# Patient Record
Sex: Female | Born: 1960 | Race: White | Hispanic: No | Marital: Married | Smoking: Former smoker
Health system: Southern US, Community
[De-identification: ages and names within clinical notes are randomized; demographics above are authoritative.]

## PROBLEM LIST (undated history)

## (undated) DIAGNOSIS — N200 Calculus of kidney: Secondary | ICD-10-CM

## (undated) DIAGNOSIS — C439 Malignant melanoma of skin, unspecified: Secondary | ICD-10-CM

## (undated) DIAGNOSIS — I10 Essential (primary) hypertension: Secondary | ICD-10-CM

## (undated) DIAGNOSIS — M797 Fibromyalgia: Secondary | ICD-10-CM

## (undated) HISTORY — PX: OTHER SURGICAL HISTORY: SHX169

## (undated) HISTORY — PX: TUBAL LIGATION: SHX77

## (undated) HISTORY — DX: Malignant melanoma of skin, unspecified: C43.9

---

## 1999-08-05 ENCOUNTER — Other Ambulatory Visit: Admission: RE | Admit: 1999-08-05 | Discharge: 1999-08-05 | Payer: Self-pay | Admitting: Obstetrics and Gynecology

## 1999-10-27 HISTORY — PX: KNEE SURGERY: SHX244

## 2000-04-29 ENCOUNTER — Other Ambulatory Visit: Admission: RE | Admit: 2000-04-29 | Discharge: 2000-04-29 | Payer: Self-pay | Admitting: Obstetrics and Gynecology

## 2000-04-29 ENCOUNTER — Encounter (INDEPENDENT_AMBULATORY_CARE_PROVIDER_SITE_OTHER): Payer: Self-pay

## 2000-11-30 ENCOUNTER — Other Ambulatory Visit: Admission: RE | Admit: 2000-11-30 | Discharge: 2000-11-30 | Payer: Self-pay | Admitting: Obstetrics and Gynecology

## 2001-02-11 ENCOUNTER — Encounter: Admission: RE | Admit: 2001-02-11 | Discharge: 2001-02-11 | Payer: Self-pay | Admitting: Family Medicine

## 2001-02-11 ENCOUNTER — Encounter: Payer: Self-pay | Admitting: Family Medicine

## 2001-12-16 ENCOUNTER — Encounter: Payer: Self-pay | Admitting: Urology

## 2001-12-16 ENCOUNTER — Ambulatory Visit (HOSPITAL_COMMUNITY): Admission: AD | Admit: 2001-12-16 | Discharge: 2001-12-16 | Payer: Self-pay | Admitting: Urology

## 2001-12-22 ENCOUNTER — Emergency Department (HOSPITAL_COMMUNITY): Admission: EM | Admit: 2001-12-22 | Discharge: 2001-12-22 | Payer: Self-pay

## 2002-01-17 ENCOUNTER — Ambulatory Visit (HOSPITAL_COMMUNITY): Admission: RE | Admit: 2002-01-17 | Discharge: 2002-01-17 | Payer: Self-pay | Admitting: Urology

## 2002-01-17 ENCOUNTER — Encounter: Payer: Self-pay | Admitting: Urology

## 2004-05-27 ENCOUNTER — Encounter: Admission: RE | Admit: 2004-05-27 | Discharge: 2004-05-27 | Payer: Self-pay | Admitting: Family Medicine

## 2004-05-30 ENCOUNTER — Encounter: Admission: RE | Admit: 2004-05-30 | Discharge: 2004-05-30 | Payer: Self-pay | Admitting: Family Medicine

## 2005-07-09 ENCOUNTER — Encounter: Admission: RE | Admit: 2005-07-09 | Discharge: 2005-07-09 | Payer: Self-pay | Admitting: Family Medicine

## 2008-05-16 ENCOUNTER — Encounter: Admission: RE | Admit: 2008-05-16 | Discharge: 2008-05-16 | Payer: Self-pay | Admitting: Family Medicine

## 2010-11-11 ENCOUNTER — Encounter
Admission: RE | Admit: 2010-11-11 | Discharge: 2010-11-11 | Payer: Self-pay | Source: Home / Self Care | Attending: Family Medicine | Admitting: Family Medicine

## 2011-03-13 NOTE — Op Note (Signed)
[facility]  Patient:    CAY, KATH Visit Number: [id number] MRN: [id number]          Service Type: DSU Location: DAY Attending Physician:  Evlyn Clines Dictated by:   Excell Seltzer. Annabell Howells, M.D. Proc. Date: 12/16/01 Admit Date:  12/16/2001   CC:         Dr. Dareen Piano, Bertram Savin. Prac.   Operative Report  PROCEDURE:  Cystoscopy, left ureteroscopic stone extraction, insertion of left double J stent.  PREOPERATIVE DIAGNOSIS:  Left UV ______ stone.  POSTOPERATIVE DIAGNOSIS:  Left UV ______ stone.  SURGEON:  Excell Seltzer. Annabell Howells, M.D.  ANESTHESIA:  General.  SPECIMEN:  Stone.  DRAINS:  6 [place] with 24 cm double J stent.  COMPLICATIONS:  None.  INDICATIONS FOR PROCEDURE:  Ms. Stlouis is a 50 year old white female who was seen in consultation with acute onset of left flank pain. CT scan revealed hydronephrosis and a questionable stone. She had had a four week history of some irritative voiding symptoms prior to that. She was initially treated conservatively but she returned to the office today with recurrent pain and IVP confirmed the presence of the stone in the distal ureter and after discussing the options, she elected to undergo ureteroscopic stone extraction. The risks were explained and accepted.  FINDINGS AND PROCEDURE:  The patient was given p.o. Tequin. She was taken to the operating room where general anesthetic was induced. She was placed in lithotomy position. Her perineum and genitalia were prepped with Betadine solution and she was draped in the usual sterile fashion. Cystoscopy was performed using a 22 [place] scope and 12 and 70 degree lenses. Examination revealed a normal urethra. The bladder wall was smooth and pale without lesions. The ureteral orifices were in the normal anatomic position. An attempt was made to pass the 6 French ureteroscope up the right ureteral orifice. I was able to negotiate the meatus, but  there was some stricturing proximal to the meatus. The stone could not be visualized at this point. A guidewire was then passed up the ureter to the kidney and a 4 cm 15 French balloon dilation catheter was then passed in the distal ureter where it was dilated to 12 atmospheres. There was some waisting of the distal ureter 2 cm proximal to the meatus. Once the dilation had been completed, the wire was left in place and a 6 French ureteroscope was then passed along side the wire. There was some splitting of the mucosa in the area of the previous stricture; however, I was able to negotiate the scope along side the wire by this split area. The split was longitudinal and the ureter was not disrupted transversely. The stone was visualized at this point, grasped with a nitinol basket and removed without difficulty. The cystoscope was then reinserted over the wire and a 6 French 24 cm double J stent with string was inserted under fluoroscopic guidance. Once in good position, the wire was removed leaving a good coil in the kidney and a good coil in the bladder. The bladder was drained, the cystoscope was removed leaving the stent string exiting the urethra. The stent string was tied closed to the meatus and trimmed short. The patient was taken down from lithotomy position, her anesthetic was reversed and she was moved to the recovery room in stable condition. There were no complications. Dictated by:   Excell Seltzer. Annabell Howells, M.D. Attending Physician:  Evlyn Clines DD:  12/16/01 TD:  12/17/01 Job: [id number]

## 2012-02-04 ENCOUNTER — Other Ambulatory Visit: Payer: Self-pay | Admitting: Family Medicine

## 2012-02-04 DIAGNOSIS — M25561 Pain in right knee: Secondary | ICD-10-CM

## 2012-02-08 ENCOUNTER — Ambulatory Visit
Admission: RE | Admit: 2012-02-08 | Discharge: 2012-02-08 | Disposition: A | Discharge status: Home / Self Care | Payer: BC Managed Care – PPO | Source: Ambulatory Visit | Attending: Family Medicine | Admitting: Family Medicine

## 2012-02-08 DIAGNOSIS — M25561 Pain in right knee: Secondary | ICD-10-CM

## 2012-08-04 ENCOUNTER — Other Ambulatory Visit: Payer: Self-pay | Admitting: Family Medicine

## 2012-08-04 DIAGNOSIS — Z1231 Encounter for screening mammogram for malignant neoplasm of breast: Secondary | ICD-10-CM

## 2012-08-30 ENCOUNTER — Ambulatory Visit
Admission: RE | Admit: 2012-08-30 | Discharge: 2012-08-30 | Disposition: A | Discharge status: Home / Self Care | Payer: BC Managed Care – PPO | Source: Ambulatory Visit | Attending: Family Medicine | Admitting: Family Medicine

## 2012-08-30 DIAGNOSIS — Z1231 Encounter for screening mammogram for malignant neoplasm of breast: Secondary | ICD-10-CM

## 2012-11-30 ENCOUNTER — Observation Stay (HOSPITAL_COMMUNITY)
Admission: EM | Admit: 2012-11-30 | Discharge: 2012-12-01 | Disposition: A | Discharge status: Home / Self Care | Payer: BC Managed Care – PPO | Attending: Emergency Medicine | Admitting: Emergency Medicine

## 2012-11-30 ENCOUNTER — Encounter (HOSPITAL_COMMUNITY): Payer: Self-pay | Admitting: *Deleted

## 2012-11-30 ENCOUNTER — Other Ambulatory Visit: Payer: Self-pay

## 2012-11-30 ENCOUNTER — Emergency Department (HOSPITAL_COMMUNITY): Payer: BC Managed Care – PPO

## 2012-11-30 DIAGNOSIS — R51 Headache: Secondary | ICD-10-CM | POA: Insufficient documentation

## 2012-11-30 DIAGNOSIS — R079 Chest pain, unspecified: Principal | ICD-10-CM | POA: Insufficient documentation

## 2012-11-30 DIAGNOSIS — E876 Hypokalemia: Secondary | ICD-10-CM

## 2012-11-30 DIAGNOSIS — R209 Unspecified disturbances of skin sensation: Secondary | ICD-10-CM | POA: Insufficient documentation

## 2012-11-30 DIAGNOSIS — I10 Essential (primary) hypertension: Secondary | ICD-10-CM | POA: Insufficient documentation

## 2012-11-30 DIAGNOSIS — M79609 Pain in unspecified limb: Secondary | ICD-10-CM | POA: Insufficient documentation

## 2012-11-30 DIAGNOSIS — R0602 Shortness of breath: Secondary | ICD-10-CM | POA: Insufficient documentation

## 2012-11-30 HISTORY — DX: Essential (primary) hypertension: I10

## 2012-11-30 HISTORY — DX: Calculus of kidney: N20.0

## 2012-11-30 HISTORY — DX: Fibromyalgia: M79.7

## 2012-11-30 LAB — BASIC METABOLIC PANEL
Calcium: 9.7 mg/dL (ref 8.4–10.5)
Chloride: 96 mEq/L (ref 96–112)
Creatinine, Ser: 0.67 mg/dL (ref 0.50–1.10)
GFR calc Af Amer: >90 mL/min (ref >90)
Glucose, Bld: 129 mg/dL — ABNORMAL HIGH (ref 70–99)
Potassium: 3 mEq/L — ABNORMAL LOW (ref 3.5–5.1)
Sodium: 137 mEq/L (ref 135–145)

## 2012-11-30 LAB — URINALYSIS, ROUTINE W REFLEX MICROSCOPIC
Glucose, UA: NEGATIVE mg/dL
Hgb urine dipstick: NEGATIVE
Ketones, ur: NEGATIVE mg/dL
Leukocytes, UA: NEGATIVE
Protein, ur: NEGATIVE mg/dL
pH: 7 (ref 5.0–8.0)

## 2012-11-30 LAB — POCT I-STAT TROPONIN I: Troponin i, poc: 0 ng/mL (ref 0.00–0.08)

## 2012-11-30 LAB — CBC
Hemoglobin: 14 g/dL (ref 12.0–15.0)
MCH: 28.5 pg (ref 26.0–34.0)
Platelets: 293 10*3/uL (ref 150–400)
RBC: 4.92 MIL/uL (ref 3.87–5.11)

## 2012-11-30 MED ORDER — SODIUM CHLORIDE 0.9 % IV SOLN
1000.0000 mL | INTRAVENOUS | Status: DC
  Administered 2012-11-30 – 2012-12-01 (×2): 1000 mL via INTRAVENOUS

## 2012-11-30 MED ORDER — MORPHINE SULFATE 4 MG/ML IJ SOLN
4.0000 mg | Freq: Once | INTRAMUSCULAR | Status: AC
  Administered 2012-11-30: 4 mg via INTRAVENOUS
  Filled 2012-11-30: qty 1

## 2012-11-30 MED ORDER — ONDANSETRON HCL 4 MG/2ML IJ SOLN
4.0000 mg | Freq: Once | INTRAMUSCULAR | Status: AC
  Administered 2012-11-30: 4 mg via INTRAVENOUS
  Filled 2012-11-30: qty 2

## 2012-11-30 NOTE — ED Notes (Signed)
Report given to [place], rn.

## 2012-11-30 NOTE — ED Notes (Signed)
Pt is here with one week of intermittent chest pain, but today has chest pain with left arm tingling.  Pt reports nausea and vomiting today and headache,.  Pt states hard to get deep breath

## 2012-11-30 NOTE — ED Provider Notes (Signed)
History     CSN: [id number]  Arrival date & time 11/30/12  4098   First MD Initiated Contact with Patient 11/30/12 2027      Chief Complaint  Patient presents with  . Chest Pain  . Headache    (Consider location/radiation/quality/duration/timing/severity/associated sxs/prior treatment) HPI Comments: Patient presents to the ER for evaluation of chest pain. She is having intermittent chest pain for about a week. Patient reports that it was in his right side, but today she had left-sided discomfort and radiated into the left arm. She is still experiencing some tingling in the left arm. She has had mild shortness of breath associated with it. Pain was nonexertional, came on at rest. She does have some increased pain with movement.  Patient is a 52 y.o. female presenting with chest pain and headaches.  Chest Pain Primary symptoms include shortness of breath.    Headache  Associated symptoms include shortness of breath.    Past Medical History  Diagnosis Date  . Hypertension   . Fibromyalgia   . Kidney stones     Past Surgical History  Procedure Date  . Tubal ligation     No family history on file.  History  Substance Use Topics  . Smoking status: Never Smoker   . Smokeless tobacco: Not on file  . Alcohol Use: No    OB History    Grav Para Term Preterm Abortions TAB SAB Ect Mult Living                  Review of Systems  Respiratory: Positive for shortness of breath.   Cardiovascular: Positive for chest pain.  Neurological: Positive for headaches.  All other systems reviewed and are negative.    Allergies  Ambien; Cefprozil; Celebrex; Cymbalta; Lunesta; Mobic; Sulfa antibiotics; Ultram; Valtrex; and Voltaren  Home Medications   Current Outpatient Rx  Name  Route  Sig  Dispense  Refill  . AMLODIPINE BESYLATE-VALSARTAN 5-160 MG PO TABS   Oral   Take 1 tablet by mouth daily.         Marland Kitchen VITAMIN D-3 1000 UNITS PO CAPS   Oral   Take 1 capsule by mouth  daily.         . CYCLOBENZAPRINE HCL 10 MG PO TABS   Oral   Take 5 mg by mouth 3 (three) times daily as needed. For muscle spasms         . HYDROCODONE-ACETAMINOPHEN 5-500 MG PO TABS   Oral   Take 1 tablet by mouth every 6 (six) hours as needed. For pain         . ROYAL JELLY PO   Oral   Take 1 tablet by mouth daily.           BP 126/75  Pulse 82  Temp 97.9 F (36.6 C) (Oral)  Resp 20  SpO2 98%  LMP 11/14/2012  Physical Exam  Constitutional: She is oriented to person, place, and time. She appears well-developed and well-nourished. No distress.  HENT:  Head: Normocephalic and atraumatic.  Right Ear: Hearing normal.  Nose: Nose normal.  Mouth/Throat: Oropharynx is clear and moist and mucous membranes are normal.  Eyes: Conjunctivae normal and EOM are normal. Pupils are equal, round, and reactive to light.  Neck: Normal range of motion. Neck supple.  Cardiovascular: Normal rate, regular rhythm, S1 normal and S2 normal.  Exam reveals no gallop and no friction rub.   No murmur heard. Pulmonary/Chest: Effort normal and breath sounds normal.  No respiratory distress. She exhibits tenderness.  Abdominal: Soft. Normal appearance and bowel sounds are normal. There is no hepatosplenomegaly. There is no tenderness. There is no rebound, no guarding, no tenderness at McBurney's point and negative Murphy's sign. No hernia.  Musculoskeletal: Normal range of motion.  Neurological: She is alert and oriented to person, place, and time. She has normal strength. No cranial nerve deficit or sensory deficit. Coordination normal. GCS eye subscore is 4. GCS verbal subscore is 5. GCS motor subscore is 6.  Skin: Skin is warm, dry and intact. No rash noted. No cyanosis.  Psychiatric: She has a normal mood and affect. Her speech is normal and behavior is normal. Thought content normal.    ED Course  Procedures (including critical care time)   Date: 11/30/2012  Rate: 96  Rhythm: normal sinus  rhythm  QRS Axis: normal  Intervals: normal  ST/T Wave abnormalities: normal  Conduction Disutrbances:none  Narrative Interpretation: poor R wave progression anterior leads  Old EKG Reviewed: none available    Labs Reviewed  CBC - Abnormal; Notable for the following:    WBC 11.7 (*)     All other components within normal limits  BASIC METABOLIC PANEL - Abnormal; Notable for the following:    Potassium 3.0 (*)     Glucose, Bld 129 (*)     All other components within normal limits  URINALYSIS, ROUTINE W REFLEX MICROSCOPIC  POCT I-STAT TROPONIN I   Dg Chest 2 View  11/30/2012  *RADIOLOGY REPORT*  Clinical Data: Right-sided chest pain.  Shortness of breath.  CHEST - 2 VIEW  Comparison: None available.  Findings: The heart size is normal.  The lungs are clear.  The visualized soft tissues and bony thorax are unremarkable.  IMPRESSION: Negative chest.   Original Report Authenticated By: Marin Roberts, M.D.      Diagnosis: Chest pain    MDM  Patient comes to the ER for evaluation of chest pain. She has been having intermittent pain in the chest for a week, but today she is complaining specifically of left-sided chest pain with radiation to left arm. She does not have significant cardiac risk factors, other than hypertension. No significant family history. No diabetes. Pain is atypical in nature today. EKG was unremarkable. She did have poor are wave progression anterior leads, might be normal for her. No ST changes. Patient will be placed in the CDU on chest pain protocol.        Gilda Crease, MD 11/30/12 2106

## 2012-12-01 DIAGNOSIS — R072 Precordial pain: Secondary | ICD-10-CM

## 2012-12-01 LAB — POCT I-STAT TROPONIN I: Troponin i, poc: 0 ng/mL (ref 0.00–0.08)

## 2012-12-01 MED ORDER — HYDROCODONE-ACETAMINOPHEN 5-325 MG PO TABS
2.0000 | ORAL_TABLET | Freq: Once | ORAL | Status: AC
  Administered 2012-12-01: 2 via ORAL
  Filled 2012-12-01: qty 2

## 2012-12-01 MED ORDER — MORPHINE SULFATE 4 MG/ML IJ SOLN
4.0000 mg | Freq: Once | INTRAMUSCULAR | Status: AC
  Administered 2012-12-01: 4 mg via INTRAVENOUS
  Filled 2012-12-01: qty 1

## 2012-12-01 MED ORDER — POTASSIUM CHLORIDE CRYS ER 20 MEQ PO TBCR: 20.0000 meq | EXTENDED_RELEASE_TABLET | Freq: Every day | ORAL | Status: AC

## 2012-12-01 MED ORDER — POTASSIUM CHLORIDE 10 MEQ/100ML IV SOLN
10.0000 meq | Freq: Once | INTRAVENOUS | Status: AC
  Administered 2012-12-01: 10 meq via INTRAVENOUS
  Filled 2012-12-01: qty 100

## 2012-12-01 MED ORDER — POTASSIUM CHLORIDE CRYS ER 20 MEQ PO TBCR
40.0000 meq | EXTENDED_RELEASE_TABLET | Freq: Once | ORAL | Status: AC
  Administered 2012-12-01: 40 meq via ORAL
  Filled 2012-12-01: qty 2

## 2012-12-01 NOTE — ED Notes (Signed)
Potassium complete, ECHO notified

## 2012-12-01 NOTE — ED Notes (Signed)
Patient transported to Ultrasound  For her ECHO

## 2012-12-01 NOTE — Progress Notes (Signed)
Echocardiogram Echocardiogram Stress Test has been performed.  Yolanda Becker 12/01/2012, 2:42 PM

## 2012-12-01 NOTE — ED Provider Notes (Addendum)
Pt had been placed on cp r/o obs protocol.  Cardiology called, they will do stress test today, they request we give pt kcl supplementation as k low. k 3. kcl 10 meq iv over 1 hr, kcl 40 meq po.    Suzi Roots, MD 12/01/12 [phone]   Cardiology indicates stress echo neg, pt ready for d/c.     Suzi Roots, MD 12/01/12 [phone]

## 2012-12-01 NOTE — ED Notes (Signed)
Pt returns from ECHO, not done due to low potassium, new orders received

## 2012-12-01 NOTE — ED Notes (Signed)
Call to vascular, message left for ECHO tech

## 2012-12-01 NOTE — ED Notes (Signed)
Call to ECHO, message left to call me regarding patient and her ECHO

## 2012-12-01 NOTE — Progress Notes (Signed)
UR completed OBS 

## 2012-12-10 ENCOUNTER — Other Ambulatory Visit: Payer: Self-pay

## 2013-08-31 ENCOUNTER — Other Ambulatory Visit: Payer: Self-pay

## 2014-06-21 ENCOUNTER — Other Ambulatory Visit: Payer: Self-pay

## 2014-06-21 DIAGNOSIS — Z1231 Encounter for screening mammogram for malignant neoplasm of breast: Secondary | ICD-10-CM

## 2014-06-25 ENCOUNTER — Ambulatory Visit
Admission: RE | Admit: 2014-06-25 | Discharge: 2014-06-25 | Disposition: A | Discharge status: Home / Self Care | Payer: BC Managed Care – PPO | Source: Ambulatory Visit

## 2014-06-25 ENCOUNTER — Other Ambulatory Visit: Payer: Self-pay | Admitting: Family Medicine

## 2014-06-25 DIAGNOSIS — Z1231 Encounter for screening mammogram for malignant neoplasm of breast: Secondary | ICD-10-CM

## 2014-06-26 ENCOUNTER — Other Ambulatory Visit: Payer: Self-pay | Admitting: Family Medicine

## 2014-06-26 DIAGNOSIS — N63 Unspecified lump in unspecified breast: Secondary | ICD-10-CM

## 2016-12-08 ENCOUNTER — Encounter (HOSPITAL_COMMUNITY): Payer: Self-pay

## 2016-12-08 DIAGNOSIS — I10 Essential (primary) hypertension: Secondary | ICD-10-CM | POA: Diagnosis not present

## 2016-12-08 DIAGNOSIS — R1084 Generalized abdominal pain: Secondary | ICD-10-CM | POA: Insufficient documentation

## 2016-12-08 DIAGNOSIS — Z79899 Other long term (current) drug therapy: Secondary | ICD-10-CM | POA: Insufficient documentation

## 2016-12-08 DIAGNOSIS — R109 Unspecified abdominal pain: Secondary | ICD-10-CM | POA: Diagnosis present

## 2016-12-08 LAB — URINALYSIS, ROUTINE W REFLEX MICROSCOPIC
Bilirubin Urine: NEGATIVE
Glucose, UA: NEGATIVE mg/dL
Hgb urine dipstick: NEGATIVE
KETONES UR: 5 mg/dL — AB
LEUKOCYTES UA: NEGATIVE
NITRITE: NEGATIVE
PH: 7 (ref 5.0–8.0)
PROTEIN: NEGATIVE mg/dL
Specific Gravity, Urine: 1.021 (ref 1.005–1.030)

## 2016-12-08 LAB — POC URINE PREG, ED: PREG TEST UR: NEGATIVE

## 2016-12-08 NOTE — ED Triage Notes (Signed)
Pt states she starting having cramping in left flank about  3 hour ago; pt c/o n/v; pt a&ox4 on arrival. Pt states pain at 10/10; pt states she hs hx of same sx

## 2016-12-09 ENCOUNTER — Emergency Department (HOSPITAL_COMMUNITY): Payer: Commercial Managed Care - HMO

## 2016-12-09 ENCOUNTER — Emergency Department (HOSPITAL_COMMUNITY)
Admission: EM | Admit: 2016-12-09 | Discharge: 2016-12-09 | Disposition: A | Discharge status: Home / Self Care | Payer: Commercial Managed Care - HMO | Attending: Emergency Medicine | Admitting: Emergency Medicine

## 2016-12-09 DIAGNOSIS — R109 Unspecified abdominal pain: Secondary | ICD-10-CM

## 2016-12-09 DIAGNOSIS — R1084 Generalized abdominal pain: Secondary | ICD-10-CM

## 2016-12-09 LAB — BASIC METABOLIC PANEL
Anion gap: 13 (ref 5–15)
BUN: 18 mg/dL (ref 6–20)
CHLORIDE: 97 mmol/L — AB (ref 101–111)
CO2: 24 mmol/L (ref 22–32)
Calcium: 9.6 mg/dL (ref 8.9–10.3)
Creatinine, Ser: 0.89 mg/dL (ref 0.44–1.00)
GFR calc Af Amer: >60 mL/min (ref >60)
GFR calc non Af Amer: >60 mL/min (ref >60)
GLUCOSE: 124 mg/dL — AB (ref 65–99)
POTASSIUM: 3.4 mmol/L — AB (ref 3.5–5.1)
Sodium: 134 mmol/L — ABNORMAL LOW (ref 135–145)

## 2016-12-09 LAB — CBC
HEMATOCRIT: 39.8 % (ref 36.0–46.0)
HEMOGLOBIN: 13 g/dL (ref 12.0–15.0)
MCH: 27.8 pg (ref 26.0–34.0)
MCHC: 32.7 g/dL (ref 30.0–36.0)
MCV: 85.2 fL (ref 78.0–100.0)
Platelets: 240 10*3/uL (ref 150–400)
RBC: 4.67 MIL/uL (ref 3.87–5.11)
RDW: 13.1 % (ref 11.5–15.5)
WBC: 11.1 10*3/uL — ABNORMAL HIGH (ref 4.0–10.5)

## 2016-12-09 MED ORDER — SODIUM CHLORIDE 0.9 % IV BOLUS (SEPSIS)
1000.0000 mL | Freq: Once | INTRAVENOUS | Status: AC
  Administered 2016-12-09: 1000 mL via INTRAVENOUS

## 2016-12-09 MED ORDER — ONDANSETRON HCL 4 MG/2ML IJ SOLN
4.0000 mg | Freq: Once | INTRAMUSCULAR | Status: AC
  Administered 2016-12-09: 4 mg via INTRAVENOUS
  Filled 2016-12-09: qty 2

## 2016-12-09 MED ORDER — ONDANSETRON 4 MG PO TBDP: ORAL_TABLET | ORAL | 0 refills | Status: DC

## 2016-12-09 MED ORDER — HYDROCODONE-ACETAMINOPHEN 5-325 MG PO TABS: 1.0000 | ORAL_TABLET | Freq: Four times a day (QID) | ORAL | 0 refills | Status: DC | PRN

## 2016-12-09 MED ORDER — MORPHINE SULFATE (PF) 4 MG/ML IV SOLN
4.0000 mg | INTRAVENOUS | Status: AC
  Administered 2016-12-09 (×3): 4 mg via INTRAVENOUS
  Filled 2016-12-09 (×3): qty 1

## 2016-12-09 NOTE — ED Notes (Addendum)
Pt reporting RIGHT flank pain since 3 hr PTA; pt reports hx of kidney stones 16 yrs ago, saw urologist last month; pt also stating that the pain radiates to the RIGHT lower back and has moved down since shes been here; pt reporting pain is similar to the pain experienced during last kidney stone

## 2016-12-09 NOTE — ED Notes (Signed)
Patient transported to CT 

## 2016-12-09 NOTE — Discharge Instructions (Signed)
1. Medications: zofran, vicodin, usual home medications 2. Treatment: rest, drink plenty of fluids, advance diet slowly 3. Follow Up: Please followup with your primary doctor in 2 days for discussion of your diagnoses and further evaluation after today's visit; if you do not have a primary care doctor use the resource guide provided to find one; Please return to the ER for persistent vomiting, high fevers or worsening symptoms  

## 2016-12-09 NOTE — ED Provider Notes (Signed)
[facility] DEPT Provider Note   CSN: [id number] Arrival date & time: 12/08/16  2150     History   Chief Complaint Chief Complaint  Patient presents with  . Flank Pain    HPI Yolanda Becker is a 56 y.o. female with a hx of ovarian cyst, kidney stone (2000 requiring surgery), BTL (1994), fibromyalgia, HTN presents to the Emergency Department complaining of acute, persistent, somewhat improved Right flank pain onset 7:30pm. Patient reports symptoms are waxing and waning but never resolving. She states pain is similar to previous kidney stone. She reports severe nausea but no vomiting or diarrhea. No hematuria, dysuria, urinary urgency or urinary frequency. She reports she had a physical exam this morning with blood work however I am unable to locate this in the chart.  Patient reports she has previously seen urology but cannot remember the name of the physician. She currently rates her pain at an 8/10 and states it is sharp and stabbing radiating from the right flank into the right pelvis. No vaginal discharge or difficulty urinating.  Patient reports she's had generalized abdominal pain for the last several months. She has seen her primary care physician and was given MiraLAX with some relief.     The history is provided by the patient and medical records. No language interpreter was used.    Past Medical History:  Diagnosis Date  . Fibromyalgia   . Hypertension   . Kidney stones     There are no active problems to display for this patient.   Past Surgical History:  Procedure Laterality Date  . TUBAL LIGATION      OB History    No data available       Home Medications    Prior to Admission medications   Medication Sig Start Date End Date Taking? Authorizing Provider  Amlodipine-Valsartan-HCTZ 5-160-25 MG TABS Take 1 tablet by mouth daily. 11/16/16  Yes Historical Provider, MD  potassium chloride SA (K-DUR,KLOR-CON) 20 MEQ tablet Take 1 tablet (20 mEq total) by  mouth daily. 12/01/12  Yes Lajean Saver, MD  HYDROcodone-acetaminophen (NORCO/VICODIN) 5-325 MG tablet Take 1-2 tablets by mouth every 6 (six) hours as needed for moderate pain or severe pain. 12/09/16   Melodye Swor, PA-C  ondansetron (ZOFRAN ODT) 4 MG disintegrating tablet 4mg  ODT q4 hours prn nausea/vomit 12/09/16   Jarrett Soho Vickii Volland, PA-C    Family History No family history on file.  Social History Social History  Substance Use Topics  . Smoking status: Never Smoker  . Smokeless tobacco: Not on file  . Alcohol use No     Allergies   Ambien [zolpidem tartrate]; Cefprozil; Celebrex [celecoxib]; Cymbalta [duloxetine hcl]; Lunesta [eszopiclone]; Mobic [meloxicam]; Sulfa antibiotics; Ultram [tramadol]; Valtrex [valacyclovir hcl]; and Voltaren [diclofenac sodium]   Review of Systems Review of Systems  Gastrointestinal: Positive for abdominal pain and nausea.  Genitourinary: Positive for flank pain.  All other systems reviewed and are negative.    Physical Exam Updated Vital Signs BP 113/66 (BP Location: Right Arm)   Pulse 81   Temp 98.1 F (36.7 C) (Oral)   Resp 14   SpO2 95%   Physical Exam  Constitutional: She appears well-developed and well-nourished. No distress.  Awake, alert, nontoxic appearance  HENT:  Head: Normocephalic and atraumatic.  Mouth/Throat: Oropharynx is clear and moist. No oropharyngeal exudate.  Eyes: Conjunctivae are normal. No scleral icterus.  Neck: Normal range of motion. Neck supple.  Cardiovascular: Normal rate, regular rhythm and intact distal pulses.   Pulmonary/Chest:  Effort normal and breath sounds normal. No respiratory distress. She has no wheezes.  Equal chest expansion  Abdominal: Soft. Bowel sounds are normal. She exhibits no mass. There is tenderness in the right upper quadrant, right lower quadrant and periumbilical area. There is CVA tenderness ( right). There is no rebound and no guarding.  Musculoskeletal: Normal range of  motion. She exhibits no edema.  Neurological: She is alert.  Speech is clear and goal oriented Moves extremities without ataxia  Skin: Skin is warm and dry. She is not diaphoretic.  Psychiatric: She has a normal mood and affect.  Nursing note and vitals reviewed.    ED Treatments / Results  Labs (all labs ordered are listed, but only abnormal results are displayed) Labs Reviewed  URINALYSIS, ROUTINE W REFLEX MICROSCOPIC - Abnormal; Notable for the following:       Result Value   Ketones, ur 5 (*)    All other components within normal limits  CBC - Abnormal; Notable for the following:    WBC 11.1 (*)    All other components within normal limits  BASIC METABOLIC PANEL - Abnormal; Notable for the following:    Sodium 134 (*)    Potassium 3.4 (*)    Chloride 97 (*)    Glucose, Bld 124 (*)    All other components within normal limits  POC URINE PREG, ED     Radiology Ct Renal Stone Study  Result Date: 12/09/2016 CLINICAL DATA:  Right flank pain. History of kidney stones and tubal ligation. EXAM: CT ABDOMEN AND PELVIS WITHOUT CONTRAST TECHNIQUE: Multidetector CT imaging of the abdomen and pelvis was performed following the standard protocol without IV contrast. COMPARISON:  None. FINDINGS: Lower chest: Slight fibrosis and peripheral emphysematous changes in the lungs. Hepatobiliary: No focal liver abnormality is seen. No gallstones, gallbladder wall thickening, or biliary dilatation. Pancreas: Unremarkable. No pancreatic ductal dilatation or surrounding inflammatory changes. Spleen: Normal in size without focal abnormality. Adrenals/Urinary Tract: Adrenal glands are unremarkable. Kidneys are normal, without renal calculi, focal lesion, or hydronephrosis. Bladder is unremarkable. Stomach/Bowel: Stomach and small bowel are decompressed. Scattered stool throughout the colon without colonic dilatation or wall thickening. Appendix is not identified. Vascular/Lymphatic: Aortic atherosclerosis.  No enlarged abdominal or pelvic lymph nodes. Reproductive: Uterus and bilateral adnexa are unremarkable. Previous tubal ligations. Other: No abdominal wall hernia or abnormality. No abdominopelvic ascites. Musculoskeletal: No acute or significant osseous findings. IMPRESSION: No renal or ureteral stone or obstruction. No acute process demonstrated in the abdomen or pelvis on noncontrast imaging. Slight fibrosis and emphysematous changes in the lung bases. Electronically Signed   By: Lucienne Capers M.D.   On: 12/09/2016 02:36    Procedures Procedures (including critical care time)  Medications Ordered in ED Medications  morphine 4 MG/ML injection 4 mg (4 mg Intravenous Given 12/09/16 0254)  ondansetron (ZOFRAN) injection 4 mg (4 mg Intravenous Given 12/09/16 0215)  sodium chloride 0.9 % bolus 1,000 mL (0 mLs Intravenous Stopped 12/09/16 0444)     Initial Impression / Assessment and Plan / ED Course  I have reviewed the triage vital signs and the nursing notes.  Pertinent labs & imaging results that were available during my care of the patient were reviewed by me and considered in my medical decision making (see chart for details).  Clinical Course as of Dec 10 447  Wed Dec 09, 2016  0444 [place] narcotic database access. No narcotic perception for the last 6 months.  [HM]  [id number] CT without acute  abnormality.  Appendix is not identified. Mild leukocytosis noted on lab work. Discussed with patient that without identification of the appendix there is no way to definitively rule out appendicitis and this is potentially an early appendicitis. I offered additional scanning with contrast. Patient declines at this time I think this is reasonable as there is a low likelihood that this is appendicitis. CT Renal Laren Everts [HM]  [id number] Urine keytones noted along with mild hyponatremia and hypochloremia. Fluid bolus given.   Ketones, ur: (!) 5 [HM]    Clinical Course User Index [HM] Jarrett Soho Waneta Fitting, PA-C      Patient presents with right-sided flank and abdominal pain. She's had generalized abdominal pain and this seems to be an acute exacerbation of her chronic pain. CT scan is reassuring without evidence of nephrolithiasis. Doubt renal colic at this time. Appendix is not identified however doubt appendicitis. Discussed with patient potential for early appendicitis not seen on CT scan. Offered CT with contrast and patient has declined. I believe this is reasonable. Will be discharged home with pain control and nausea medication. She is to see her primary care physician today for further evaluation and repeat abdominal exam. She started to return to the emergency department for worsening symptoms, fevers, chills, vomiting or worsening pain.  Early appendicitis discussion and precautions given.  She's tolerated by mouth in the emergency department without emesis. Her pain is well-controlled at this time.  Final Clinical Impressions(s) / ED Diagnoses   Final diagnoses:  Right flank pain  Generalized abdominal pain    New Prescriptions New Prescriptions   HYDROCODONE-ACETAMINOPHEN (NORCO/VICODIN) 5-325 MG TABLET    Take 1-2 tablets by mouth every 6 (six) hours as needed for moderate pain or severe pain.   ONDANSETRON (ZOFRAN ODT) 4 MG DISINTEGRATING TABLET    4mg  ODT q4 hours prn nausea/vomit     Abigail Butts, PA-C [id number]    Delora Fuel, MD [id number]

## 2016-12-22 ENCOUNTER — Other Ambulatory Visit: Payer: Self-pay | Admitting: Gastroenterology

## 2016-12-22 DIAGNOSIS — R1011 Right upper quadrant pain: Secondary | ICD-10-CM

## 2016-12-30 ENCOUNTER — Ambulatory Visit
Admission: RE | Admit: 2016-12-30 | Discharge: 2016-12-30 | Disposition: A | Discharge status: Home / Self Care | Payer: Commercial Managed Care - HMO | Source: Ambulatory Visit | Attending: Gastroenterology | Admitting: Gastroenterology

## 2016-12-30 DIAGNOSIS — R1011 Right upper quadrant pain: Secondary | ICD-10-CM

## 2017-01-05 ENCOUNTER — Ambulatory Visit (INDEPENDENT_AMBULATORY_CARE_PROVIDER_SITE_OTHER): Payer: Commercial Managed Care - HMO | Admitting: Women's Health

## 2017-01-05 ENCOUNTER — Encounter: Payer: Self-pay | Admitting: Women's Health

## 2017-01-05 VITALS — BP 123/84 | Ht 63.0 in | Wt 145.8 lb

## 2017-01-05 DIAGNOSIS — Z01419 Encounter for gynecological examination (general) (routine) without abnormal findings: Secondary | ICD-10-CM

## 2017-01-05 DIAGNOSIS — N952 Postmenopausal atrophic vaginitis: Secondary | ICD-10-CM

## 2017-01-05 DIAGNOSIS — Z1151 Encounter for screening for human papillomavirus (HPV): Secondary | ICD-10-CM | POA: Diagnosis not present

## 2017-01-05 MED ORDER — ESTRADIOL 10 MCG VA TABS: ORAL_TABLET | VAGINAL | 12 refills | Status: DC

## 2017-01-05 NOTE — Addendum Note (Signed)
Addended by: Nelva Nay on: 01/05/2017 04:59 PM   Modules accepted: Orders

## 2017-01-05 NOTE — Progress Notes (Signed)
Daron E Mannella June 24, 1961 [id number]    History:    Presents for annual exam. Postmenopausal with no bleeding for 55fmo/BTL on no HRT.  Occasional hot flushes,  vaginal dryness and irritation.  History of normal paps and mammograms ([facility]) . Colonoscopy done in 2014, will be reschedule in 5 years, father with colon cancer. Fibromyalgia,  hypertension-primary care manages labs and meds.  Past medical history, past surgical history, family history and social history were all reviewed and documented in the EPIC chart. Work at [employer].. Has 3 adults children and 5 grandchildren. history of kidney stone  ROS:  A ROS was performed and pertinent positives and negatives are included.  Exam:  Vitals:   01/05/17 1544  BP: 123/84  Weight: 145 lb 12.8 oz (66.1 kg)  Height: 5\' 3"  (1.6 m)   Body mass index is 25.83 kg/m.   General appearance:  Normal Thyroid:  Symmetrical, normal in size, without palpable masses or nodularity. Respiratory  Auscultation:  Clear without wheezing or rhonchi Cardiovascular  Auscultation:  Regular rate, without rubs, murmurs or gallops  Edema/varicosities:  Not grossly evident Abdominal  Soft,nontender, without masses, guarding or rebound.  Liver/spleen:  No organomegaly noted  Hernia:  None appreciated  Skin  Inspection:  Grossly normal   Breasts: Examined lying and sitting.     Right: Without masses, retractions, discharge or axillary adenopathy.     Left: Without masses, retractions, discharge or axillary adenopathy. Gentitourinary   Inguinal/mons:  Normal without inguinal adenopathy  External genitalia:  Normal  BUS/Urethra/Skene's glands:  Normal  Vagina:  Mild dry, erythematous  Cervix:  Normal  Uterus: , normal in size, shape and contour.  Midline and mobile  Adnexa/parametria:     Rt: Without masses or tenderness.   Lt: Without masses or tenderness.  Anus and perineum: Normal  Digital rectal exam: Normal sphincter tone without palpated masses  or tenderness   Assessment/Plan:  56 y.o.MWF G2P3  for annual exam.   Postmenopausal with vaginal dryness/dyspareunia Hypertension, fibromyalgia-primary care manages labs and meds  Plan: Options reviewed, will try Vagifem one applicator at bedtime for 2 weeks and then twice weekly thereafter. Started to call if no relief of dryness. Continue active lifestyle, diet and exercise, recommended DEXA in the next 2 years, calcium and Vit.D supplements encouraged. SBE's, continue annual screening mammogram. Pap with HR HPV typing.    Huel Cote [facility], 4:22 PM 01/05/2017

## 2017-01-05 NOTE — Patient Instructions (Signed)
Health Maintenance for Postmenopausal Women Menopause is a normal process in which your reproductive ability comes to an end. This process happens gradually over a span of months to years, usually between the ages of 33 and 38. Menopause is complete when you have missed 12 consecutive menstrual periods. It is important to talk with your health care provider about some of the most common conditions that affect postmenopausal women, such as heart disease, cancer, and bone loss (osteoporosis). Adopting a healthy lifestyle and getting preventive care can help to promote your health and wellness. Those actions can also lower your chances of developing some of these common conditions. What should I know about menopause? During menopause, you may experience a number of symptoms, such as:  Moderate-to-severe hot flashes.  Night sweats.  Decrease in sex drive.  Mood swings.  Headaches.  Tiredness.  Irritability.  Memory problems.  Insomnia. Choosing to treat or not to treat menopausal changes is an individual decision that you make with your health care provider. What should I know about hormone replacement therapy and supplements? Hormone therapy products are effective for treating symptoms that are associated with menopause, such as hot flashes and night sweats. Hormone replacement carries certain risks, especially as you become older. If you are thinking about using estrogen or estrogen with progestin treatments, discuss the benefits and risks with your health care provider. What should I know about heart disease and stroke? Heart disease, heart attack, and stroke become more likely as you age. This may be due, in part, to the hormonal changes that your body experiences during menopause. These can affect how your body processes dietary fats, triglycerides, and cholesterol. Heart attack and stroke are both medical emergencies. There are many things that you can do to help prevent heart disease  and stroke:  Have your blood pressure checked at least every 1-2 years. High blood pressure causes heart disease and increases the risk of stroke.  If you are 48-61 years old, ask your health care provider if you should take aspirin to prevent a heart attack or a stroke.  Do not use any tobacco products, including cigarettes, chewing tobacco, or electronic cigarettes. If you need help quitting, ask your health care provider.  It is important to eat a healthy diet and maintain a healthy weight.  Be sure to include plenty of vegetables, fruits, low-fat dairy products, and lean protein.  Avoid eating foods that are high in solid fats, added sugars, or salt (sodium).  Get regular exercise. This is one of the most important things that you can do for your health.  Try to exercise for at least 150 minutes each week. The type of exercise that you do should increase your heart rate and make you sweat. This is known as moderate-intensity exercise.  Try to do strengthening exercises at least twice each week. Do these in addition to the moderate-intensity exercise.  Know your numbers.Ask your health care provider to check your cholesterol and your blood glucose. Continue to have your blood tested as directed by your health care provider. What should I know about cancer screening? There are several types of cancer. Take the following steps to reduce your risk and to catch any cancer development as early as possible. Breast Cancer  Practice breast self-awareness.  This means understanding how your breasts normally appear and feel.  It also means doing regular breast self-exams. Let your health care provider know about any changes, no matter how small.  If you are 40 or older,  have a clinician do a breast exam (clinical breast exam or CBE) every year. Depending on your age, family history, and medical history, it may be recommended that you also have a yearly breast X-ray (mammogram).  If you  have a family history of breast cancer, talk with your health care provider about genetic screening.  If you are at high risk for breast cancer, talk with your health care provider about having an MRI and a mammogram every year.  Breast cancer (BRCA) gene test is recommended for women who have family members with BRCA-related cancers. Results of the assessment will determine the need for genetic counseling and BRCA1 and for BRCA2 testing. BRCA-related cancers include these types:  Breast. This occurs in males or females.  Ovarian.  Tubal. This may also be called fallopian tube cancer.  Cancer of the abdominal or pelvic lining (peritoneal cancer).  Prostate.  Pancreatic. Cervical, Uterine, and Ovarian Cancer  Your health care provider may recommend that you be screened regularly for cancer of the pelvic organs. These include your ovaries, uterus, and vagina. This screening involves a pelvic exam, which includes checking for microscopic changes to the surface of your cervix (Pap test).  For women ages 21-65, health care providers may recommend a pelvic exam and a Pap test every three years. For women ages 23-65, they may recommend the Pap test and pelvic exam, combined with testing for human papilloma virus (HPV), every five years. Some types of HPV increase your risk of cervical cancer. Testing for HPV may also be done on women of any age who have unclear Pap test results.  Other health care providers may not recommend any screening for nonpregnant women who are considered low risk for pelvic cancer and have no symptoms. Ask your health care provider if a screening pelvic exam is right for you.  If you have had past treatment for cervical cancer or a condition that could lead to cancer, you need Pap tests and screening for cancer for at least 20 years after your treatment. If Pap tests have been discontinued for you, your risk factors (such as having a new sexual partner) need to be reassessed  to determine if you should start having screenings again. Some women have medical problems that increase the chance of getting cervical cancer. In these cases, your health care provider may recommend that you have screening and Pap tests more often.  If you have a family history of uterine cancer or ovarian cancer, talk with your health care provider about genetic screening.  If you have vaginal bleeding after reaching menopause, tell your health care provider.  There are currently no reliable tests available to screen for ovarian cancer. Lung Cancer  Lung cancer screening is recommended for adults 99-83 years old who are at high risk for lung cancer because of a history of smoking. A yearly low-dose CT scan of the lungs is recommended if you:  Currently smoke.  Have a history of at least 30 pack-years of smoking and you currently smoke or have quit within the past 15 years. A pack-year is smoking an average of one pack of cigarettes per day for one year. Yearly screening should:  Continue until it has been 15 years since you quit.  Stop if you develop a health problem that would prevent you from having lung cancer treatment. Colorectal Cancer  This type of cancer can be detected and can often be prevented.  Routine colorectal cancer screening usually begins at age 72 and continues  through age 75.  If you have risk factors for colon cancer, your health care provider may recommend that you be screened at an earlier age.  If you have a family history of colorectal cancer, talk with your health care provider about genetic screening.  Your health care provider may also recommend using home test kits to check for hidden blood in your stool.  A small camera at the end of a tube can be used to examine your colon directly (sigmoidoscopy or colonoscopy). This is done to check for the earliest forms of colorectal cancer.  Direct examination of the colon should be repeated every 5-10 years until  age 75. However, if early forms of precancerous polyps or small growths are found or if you have a family history or genetic risk for colorectal cancer, you may need to be screened more often. Skin Cancer  Check your skin from head to toe regularly.  Monitor any moles. Be sure to tell your health care provider:  About any new moles or changes in moles, especially if there is a change in a mole's shape or color.  If you have a mole that is larger than the size of a pencil eraser.  If any of your family members has a history of skin cancer, especially at a young age, talk with your health care provider about genetic screening.  Always use sunscreen. Apply sunscreen liberally and repeatedly throughout the day.  Whenever you are outside, protect yourself by wearing long sleeves, pants, a wide-brimmed hat, and sunglasses. What should I know about osteoporosis? Osteoporosis is a condition in which bone destruction happens more quickly than new bone creation. After menopause, you may be at an increased risk for osteoporosis. To help prevent osteoporosis or the bone fractures that can happen because of osteoporosis, the following is recommended:  If you are 19-50 years old, get at least 1,000 mg of calcium and at least 600 mg of vitamin D per day.  If you are older than age 50 but younger than age 70, get at least 1,200 mg of calcium and at least 600 mg of vitamin D per day.  If you are older than age 70, get at least 1,200 mg of calcium and at least 800 mg of vitamin D per day. Smoking and excessive alcohol intake increase the risk of osteoporosis. Eat foods that are rich in calcium and vitamin D, and do weight-bearing exercises several times each week as directed by your health care provider. What should I know about how menopause affects my mental health? Depression may occur at any age, but it is more common as you become older. Common symptoms of depression include:  Low or sad  mood.  Changes in sleep patterns.  Changes in appetite or eating patterns.  Feeling an overall lack of motivation or enjoyment of activities that you previously enjoyed.  Frequent crying spells. Talk with your health care provider if you think that you are experiencing depression. What should I know about immunizations? It is important that you get and maintain your immunizations. These include:  Tetanus, diphtheria, and pertussis (Tdap) booster vaccine.  Influenza every year before the flu season begins.  Pneumonia vaccine.  Shingles vaccine. Your health care provider may also recommend other immunizations. This information is not intended to replace advice given to you by your health care provider. Make sure you discuss any questions you have with your health care provider. Document Released: 12/04/2005 Document Revised: 05/01/2016 Document Reviewed: 07/16/2015 Elsevier Interactive Patient   Education  2017 [employer]  

## 2017-01-06 ENCOUNTER — Encounter: Payer: Self-pay | Admitting: Women's Health

## 2017-01-06 LAB — PAP, TP IMAGING W/ HPV RNA, RFLX HPV TYPE 16,18/45: HPV mRNA, High Risk: NOT DETECTED

## 2017-04-05 ENCOUNTER — Emergency Department (HOSPITAL_COMMUNITY): Payer: Commercial Managed Care - HMO

## 2017-04-05 ENCOUNTER — Encounter (HOSPITAL_COMMUNITY): Payer: Self-pay | Admitting: *Deleted

## 2017-04-05 ENCOUNTER — Emergency Department (HOSPITAL_COMMUNITY)
Admission: EM | Admit: 2017-04-05 | Discharge: 2017-04-05 | Disposition: A | Discharge status: Home / Self Care | Payer: Commercial Managed Care - HMO | Attending: Emergency Medicine | Admitting: Emergency Medicine

## 2017-04-05 DIAGNOSIS — I10 Essential (primary) hypertension: Secondary | ICD-10-CM | POA: Diagnosis not present

## 2017-04-05 DIAGNOSIS — G894 Chronic pain syndrome: Secondary | ICD-10-CM | POA: Insufficient documentation

## 2017-04-05 DIAGNOSIS — Z79899 Other long term (current) drug therapy: Secondary | ICD-10-CM | POA: Diagnosis not present

## 2017-04-05 DIAGNOSIS — R51 Headache: Secondary | ICD-10-CM | POA: Diagnosis present

## 2017-04-05 DIAGNOSIS — Z87891 Personal history of nicotine dependence: Secondary | ICD-10-CM | POA: Diagnosis not present

## 2017-04-05 LAB — CBC WITH DIFFERENTIAL/PLATELET
Basophils Absolute: 0 10*3/uL (ref 0.0–0.1)
Basophils Relative: 1 %
EOS ABS: 0.1 10*3/uL (ref 0.0–0.7)
EOS PCT: 2 %
HCT: 44.9 % (ref 36.0–46.0)
HEMOGLOBIN: 14.6 g/dL (ref 12.0–15.0)
LYMPHS ABS: 2.5 10*3/uL (ref 0.7–4.0)
Lymphocytes Relative: 38 %
MCH: 28 pg (ref 26.0–34.0)
MCHC: 32.5 g/dL (ref 30.0–36.0)
MCV: 86 fL (ref 78.0–100.0)
MONO ABS: 0.7 10*3/uL (ref 0.1–1.0)
MONOS PCT: 10 %
NEUTROS PCT: 49 %
Neutro Abs: 3.2 10*3/uL (ref 1.7–7.7)
Platelets: 279 10*3/uL (ref 150–400)
RBC: 5.22 MIL/uL — ABNORMAL HIGH (ref 3.87–5.11)
RDW: 13.6 % (ref 11.5–15.5)
WBC: 6.6 10*3/uL (ref 4.0–10.5)

## 2017-04-05 LAB — BASIC METABOLIC PANEL
Anion gap: 9 (ref 5–15)
BUN: 13 mg/dL (ref 6–20)
CALCIUM: 9.8 mg/dL (ref 8.9–10.3)
CHLORIDE: 103 mmol/L (ref 101–111)
CO2: 26 mmol/L (ref 22–32)
CREATININE: 0.93 mg/dL (ref 0.44–1.00)
GFR calc Af Amer: >60 mL/min (ref >60)
GFR calc non Af Amer: >60 mL/min (ref >60)
Glucose, Bld: 91 mg/dL (ref 65–99)
Potassium: 3.6 mmol/L (ref 3.5–5.1)
SODIUM: 138 mmol/L (ref 135–145)

## 2017-04-05 MED ORDER — HYDROCODONE-ACETAMINOPHEN 5-325 MG PO TABS
1.0000 | ORAL_TABLET | Freq: Once | ORAL | Status: AC
  Administered 2017-04-05: 1 via ORAL
  Filled 2017-04-05: qty 1

## 2017-04-05 MED ORDER — HYDROCODONE-ACETAMINOPHEN 5-325 MG PO TABS: ORAL_TABLET | ORAL | 0 refills | Status: DC

## 2017-04-05 NOTE — ED Triage Notes (Signed)
C/o not feeling well Sunday am when she woke up c/o right knee pain and her entire right side pain, states pain increased at 4 am today . States she took a muscle relaxant and tylenol was able to go back to sleep

## 2017-04-05 NOTE — ED Notes (Signed)
Patient transported to CT 

## 2017-04-05 NOTE — Discharge Instructions (Signed)
Take vicodin for breakthrough pain, do not drink alcohol, drive, care for children or do other critical tasks while taking vicodin. ° °Please follow with your primary care doctor in the next 2 days for a check-up. They must obtain records for further management.  ° °Do not hesitate to return to the Emergency Department for any new, worsening or concerning symptoms.  ° °

## 2017-04-05 NOTE — ED Provider Notes (Signed)
[facility] DEPT Provider Note   CSN: [id number] Arrival date & time: 04/05/17  0934     History   Chief Complaint Chief Complaint  Patient presents with  . Facial Droop    HPI   Blood pressure (!) 162/84, pulse 85, temperature 98.6 F (37 C), temperature source Oral, resp. rate 20, height 5\' 3"  (1.6 m), weight 65.8 kg (145 lb), SpO2 97 %.  Yolanda Becker is a 56 y.o. female complaining of worsening pain on the right side of her body onset yesterday upon waking. She states it chronic pain she has in her right knee with significantly worsened, states she is limping. There was no trauma. She states that her chronic abdominal pain is also slightly worse on the right side (she's had a negative ultrasound) in the right-sided arm pain which she's had chronically is also worse and she states that she felt a tugging sensation in her face yesterday morning and is concerned that she may be having a stroke. She feels that her right eye is she had a floater in the right eye yesterday. She denies nausea, vomiting, significantly worsening cervicalgia over her baseline, chest pain, shortness of breath, ataxia, change in bowel or bladder habits. She denies weakness she describes the pain as tingling  Past Medical History:  Diagnosis Date  . Fibromyalgia   . Hypertension   . Kidney stones     There are no active problems to display for this patient.   Past Surgical History:  Procedure Laterality Date  . kidney stones    . KNEE SURGERY  2001  . TUBAL LIGATION      OB History    Gravida Para Term Preterm AB Living   2       2 3    SAB TAB Ectopic Multiple Live Births                   Home Medications    Prior to Admission medications   Medication Sig Start Date End Date Taking? Authorizing Provider  Amlodipine-Valsartan-HCTZ 5-160-25 MG TABS Take 1 tablet by mouth daily. 11/16/16   [provider]  Estradiol (VAGIFEM) 10 MCG TABS vaginal tablet Place vaginally for 2  week and then twice weely 01/05/17   Huel Cote, NP  HYDROcodone-acetaminophen (NORCO/VICODIN) 5-325 MG tablet Take 1-2 tablets by mouth every 6 hours as needed for pain and/or cough. 04/05/17   Timea Breed, Elmyra Ricks, PA-C  Omega-3 Fatty Acids (FISH OIL CONCENTRATE PO) Take by mouth.    [provider]  ondansetron (ZOFRAN ODT) 4 MG disintegrating tablet 4mg  ODT q4 hours prn nausea/vomit Patient not taking: Reported on 01/05/2017 12/09/16   Muthersbaugh, Jarrett Soho, PA-C  potassium chloride SA (K-DUR,KLOR-CON) 20 MEQ tablet Take 1 tablet (20 mEq total) by mouth daily. 12/01/12   Lajean Saver, MD    Family History Family History  Problem Relation Age of Onset  . Diabetes Mother   . Colon cancer Father   . Diabetes Sister   . Breast cancer Paternal Grandmother     Social History Social History  Substance Use Topics  . Smoking status: Former Research scientist (life sciences)  . Smokeless tobacco: Never Used  . Alcohol use No     Allergies   Ambien [zolpidem tartrate]; Cefprozil; Celebrex [celecoxib]; Cymbalta [duloxetine hcl]; Lunesta [eszopiclone]; Mobic [meloxicam]; Sulfa antibiotics; Ultram [tramadol]; Valtrex [valacyclovir hcl]; and Voltaren [diclofenac sodium]   Review of Systems Review of Systems  10 systems reviewed and found to be negative, except as noted in  the HPI.   Physical Exam Updated Vital Signs BP 124/82   Pulse 93   Temp 98.7 F (37.1 C) (Oral)   Resp 14   Ht 5\' 3"  (1.6 m)   Wt 65.8 kg (145 lb)   SpO2 91%   BMI 25.69 kg/m   Physical Exam  Constitutional: She is oriented to person, place, and time. She appears well-developed and well-nourished.  HENT:  Head: Normocephalic and atraumatic.  Mouth/Throat: Oropharynx is clear and moist.  Eyes: Conjunctivae and EOM are normal. Pupils are equal, round, and reactive to light.  No TTP of maxillary or frontal sinuses  No TTP or induration of temporal arteries bilaterally  Neck: Normal range of motion. Neck supple.  FROM to  C-spine. Pt can touch chin to chest without discomfort. No TTP of midline cervical spine.   Cardiovascular: Normal rate, regular rhythm and intact distal pulses.   Pulmonary/Chest: Effort normal and breath sounds normal. No respiratory distress. She has no wheezes. She has no rales. She exhibits no tenderness.  Abdominal: Soft. Bowel sounds are normal. There is no tenderness.  Musculoskeletal: Normal range of motion. She exhibits no edema or tenderness.  Neurological: She is alert and oriented to person, place, and time. No cranial nerve deficit.  II-Visual fields grossly intact. III/IV/VI-Extraocular movements intact.  Pupils reactive bilaterally. V/VII-Smile symmetric, equal eyebrow raise,  facial sensation intact VIII- Hearing grossly intact IX/X-Normal gag XI-bilateral shoulder shrug XII-midline tongue extension Motor: 5/5 bilaterally with normal tone and bulk Cerebellar: Normal finger-to-nose  and normal heel-to-shin test.   Romberg negative Ambulates with a coordinated gait   Nursing note and vitals reviewed.    ED Treatments / Results  Labs (all labs ordered are listed, but only abnormal results are displayed) Labs Reviewed  CBC WITH DIFFERENTIAL/PLATELET - Abnormal; Notable for the following:       Result Value   RBC 5.22 (*)    All other components within normal limits  BASIC METABOLIC PANEL    EKG  EKG Interpretation None       Radiology Ct Head Wo Contrast  Result Date: 04/05/2017 CLINICAL DATA:  Right-sided headache since yesterday. EXAM: CT HEAD WITHOUT CONTRAST TECHNIQUE: Contiguous axial images were obtained from the base of the skull through the vertex without intravenous contrast. COMPARISON:  None. FINDINGS: Brain: Ventricles, cisterns and other CSF spaces are within normal. There is no mass, mass effect, shift of midline structures or acute hemorrhage. There is no evidence of acute infarction. Vascular: No hyperdense vessel or unexpected calcification.  Skull: Normal. Negative for fracture or focal lesion. Sinuses/Orbits: No acute finding. Subtle chronic inflammatory change over the lateral left frontal sinus. Other: None. IMPRESSION: No acute intracranial findings. Electronically Signed   By: Marin Olp M.D.   On: 04/05/2017 14:36    Procedures Procedures (including critical care time)  Medications Ordered in ED Medications  HYDROcodone-acetaminophen (NORCO/VICODIN) 5-325 MG per tablet 1 tablet (1 tablet Oral Given 04/05/17 1346)     Initial Impression / Assessment and Plan / ED Course  I have reviewed the triage vital signs and the nursing notes.  Pertinent labs & imaging results that were available during my care of the patient were reviewed by me and considered in my medical decision making (see chart for details).     Vitals:   04/05/17 1430 04/05/17 1500 04/05/17 1515 04/05/17 1533  BP: 112/79 124/82    Pulse: 68 (!) 116 93   Resp: 15 13 14    Temp:  98.7 F (37.1 C)  TempSrc:    Oral  SpO2: 98% 100% 91%   Weight:      Height:        Medications  HYDROcodone-acetaminophen (NORCO/VICODIN) 5-325 MG per tablet 1 tablet (1 tablet Oral Given 04/05/17 1346)    Kedra E Kumagai is 56 y.o. female presenting with An exacerbation of her chronic pain all this pain seems to be located on the right side. Neurologic exam is nonfocal. Will obtain head CT and basic blood work. Vicodin for comfort.  Imaging negative, likely exacerbation of chronic pain, shortness Vicodin and asked her to follow closely with primary care doctor  Evaluation does not show pathology that would require ongoing emergent intervention or inpatient treatment. Pt is hemodynamically stable and mentating appropriately. Discussed findings and plan with patient/guardian, who agrees with care plan. All questions answered. Return precautions discussed and outpatient follow up given.      Final Clinical Impressions(s) / ED Diagnoses   Final diagnoses:    Chronic pain syndrome    New Prescriptions Discharge Medication List as of 04/05/2017  3:02 PM    START taking these medications   Details  HYDROcodone-acetaminophen (NORCO/VICODIN) 5-325 MG tablet Take 1-2 tablets by mouth every 6 hours as needed for pain and/or cough., Print         Vikash Nest, Charna Elizabeth 04/05/17 [place], MD 04/06/17 [phone]

## 2017-04-05 NOTE — ED Notes (Signed)
Pt returns from ct  

## 2018-02-08 ENCOUNTER — Ambulatory Visit (INDEPENDENT_AMBULATORY_CARE_PROVIDER_SITE_OTHER): Payer: 59

## 2018-02-08 ENCOUNTER — Ambulatory Visit: Payer: 59 | Admitting: Women's Health

## 2018-02-08 ENCOUNTER — Encounter: Payer: Self-pay | Admitting: Women's Health

## 2018-02-08 VITALS — BP 122/78

## 2018-02-08 DIAGNOSIS — N950 Postmenopausal bleeding: Secondary | ICD-10-CM

## 2018-02-08 NOTE — Progress Notes (Signed)
57 year old MWF G2P3 presents with complaint of 5 days of regular menses type bleeding with some low abdominal cramping.  Had been amenorrheic for greater than 2 years, year prior to amenorrhea one cycle that year.  On no HRT.  Medical problems include hypertension and fibromyalgia.  No change in routine.  Same partner.  Denies urinary symptoms, vaginal discharge with itching or odor, nausea, changes in bowel elimination or fever.  No change in medication.  Exam: Appears well.  Abdomen soft without rebound, external genitalia within normal limits, speculum exam moderate amount of dark menses type blood present.  Cervix clean without visible lesion or polyp.  Bimanual no CMT or adnexal tenderness. Ultrasound: T/V anteverted uterus fluid-filled endometrium with right posterior focal area encroaching endometrium 7 x 5 mm with positive CFD to this area, and right ovarian cyst 8 mm, left ovarian cyst internal echoes 9 mm.  Negative cul-de-sac.  Excluding fluid in endometrium 3.1, at focal area 6.1 mm.  Fluid in endometrium 24 x 3 x 16 mm.  Postmenopausal bleeding/Questionable endometrial polyp  Plan: Schedule sonohysterogram with Dr. Phineas Real.  Reviewed possible small fibroid versus endometrial polyp.

## 2018-02-08 NOTE — Patient Instructions (Signed)
Abnormal Uterine Bleeding Abnormal uterine bleeding means bleeding more than usual from your uterus. It can include:  Bleeding between periods.  Bleeding after sex.  Bleeding that is heavier than normal.  Periods that last longer than usual.  Bleeding after you have stopped having your period (menopause).  There are many problems that may cause this. You should see a doctor for any kind of bleeding that is not normal. Treatment depends on the cause of the bleeding. Follow these instructions at home:  Watch your condition for any changes.  Do not use tampons, douche, or have sex, if your doctor tells you not to.  Change your pads often.  Get regular well-woman exams. Make sure they include a pelvic exam and cervical cancer screening.  Keep all follow-up visits as told by your doctor. This is important. Contact a doctor if:  The bleeding lasts more than one week.  You feel dizzy at times.  You feel like you are going to throw up (nauseous).  You throw up. Get help right away if:  You pass out.  You have to change pads every hour.  You have belly (abdominal) pain.  You have a fever.  You get sweaty.  You get weak.  You passing large blood clots from your vagina. Summary  Abnormal uterine bleeding means bleeding more than usual from your uterus.  There are many problems that may cause this. You should see a doctor for any kind of bleeding that is not normal.  Treatment depends on the cause of the bleeding. This information is not intended to replace advice given to you by your health care provider. Make sure you discuss any questions you have with your health care provider. Document Released: 08/09/2009 Document Revised: 10/06/2016 Document Reviewed: 10/06/2016 Elsevier Interactive Patient Education  2017 [employer] [employer] is a procedure to examine the inside of the uterus. This exam uses sound waves that are sent to a computer to  make images of the lining of the uterus (endometrium). To get the best images, a germ-free, salt-water solution (sterile saline) is put into the uterus through the vagina. You may have this procedure if you have certain reproductive problems, such as abnormal bleeding, infertility, or miscarriage. This procedure can show what may be causing these problems. Possible causes include scarring or abnormal growths such as fibroids inside your uterus. It can also show if your uterus is an abnormal shape or if the lining of the uterus is too thin. Tell a health care provider about:  All medicines you are taking, including vitamins, herbs, eye drops, creams, and over-the-counter medicines.  Any allergies you have.  Any blood disorders you have.  Any surgeries you have had.  Any medical conditions you have.  Whether you are pregnant or may be pregnant.  The date of the first day of your last period.  Any signs of infection, such as fever, pain in your lower abdomen, or abnormal discharge from your vagina. What are the risks? Generally, this is a safe procedure. However, problems may occur, including:  Abdominal pain or cramping.  Light bleeding (spotting).  Increased vaginal discharge.  Infection.  What happens before the procedure?  Your health care provider may have you take an over-the-counter pain medicine.  You may be given medicine to stop any abnormal bleeding.  You may be given antibiotic medicine to help prevent infection.  You may be asked to take a pregnancy test. This is usually in the form of a urine  test.  You may have a pelvic exam.  You will be asked to empty your bladder. What happens during the procedure?  You will lie down on the exam table with your feet in stirrups or with your knees bent and your feet flat on the table.  A slender, handheld device (transducer) will be lubricated and placed into your vagina.  The transducer will be positioned to send sound  waves to your uterus. The sound waves are sent to a computer and are turned into images, which your health care provider sees during the procedure.  The transducer will be removed from your vagina.  An instrument will be inserted to widen the opening of your vagina (speculum).  A swab with germ-killing solution (antiseptic) will be used to clean the opening to your uterus (cervix).  A long, thin tube (catheter) will be placed through your cervix into your uterus.  The speculum will be removed.  The transducer will be placed back into your vagina to take more images.  Your uterus will be filled with a germ-free, salt-water solution (sterile saline) through the catheter. You may feel some cramping.  A fluid that contains air bubbles may be sent through the catheter to make it easier to see the fallopian tubes.  The transducer and catheter will be removed. The procedure may vary among health care providers and hospitals. What happens after the procedure?  It is up to you to get the results of your procedure. Ask your health care provider, or the department that is doing the procedure, when your results will be ready. Summary  A sonohysterogram is a procedure that creates images of the inside of the uterus.  The risks of this procedure are very low. Most women experience cramping and spotting after the procedure.  You may need to have a pelvic exam and take a pregnancy test before this procedure. This procedure will not be done if you are pregnant or have an infection. This information is not intended to replace advice given to you by your health care provider. Make sure you discuss any questions you have with your health care provider. Document Released: 02/26/2014 Document Revised: 09/07/2016 Document Reviewed: 09/07/2016 Elsevier Interactive Patient Education  2017 [employer].

## 2018-02-10 ENCOUNTER — Telehealth: Payer: Self-pay | Admitting: *Deleted

## 2018-02-10 MED ORDER — IBUPROFEN 800 MG PO TABS: 800.0000 mg | ORAL_TABLET | Freq: Three times a day (TID) | ORAL | 1 refills | Status: DC | PRN

## 2018-02-10 NOTE — Telephone Encounter (Signed)
If she is able to take ibuprofen then 800 mg every 8 hour #30 with 1 refill.  It appears she has a reaction to tramadol which would be the next step.  Other than that on not sure we have a lot to offer.  I do not think starting narcotics would be a good idea

## 2018-02-10 NOTE — Telephone Encounter (Signed)
Pt said she can take ibuprofen 800, Rx sent.

## 2018-02-10 NOTE — Telephone Encounter (Signed)
[name] is scheduled for [facility] on 03/08/18 states she is having constant pelvic cramping taking tylenol 500 mg and ibuprofen 600 mg and it doesn't help much. This is the next available date for [facility] and pt is placed on cancellation list. She wanted to know anything you could recommend for cramping? Please advise

## 2018-02-24 ENCOUNTER — Telehealth: Payer: Self-pay | Admitting: *Deleted

## 2018-02-24 NOTE — Telephone Encounter (Signed)
Option 1 is to find an appointment for sonohysterogram earlier by rearranging the schedules.  She could try tramadol 50 mg every 6 hour #20 to see if that does not help

## 2018-02-24 NOTE — Telephone Encounter (Signed)
Patient called to update from telephone encounter on 02/10/18. Patient said she is only spotting now, but the pelvic cramping is getting worse. Has [facility] scheduled on 03/08/18, states the cramping is now sharp pain that radiates down her legs everything from the pelvic down hurts, using ibuprofen 800 mg which doesn't help much per patient. States she can't take much ibuprofen it will make her stomach hurt,using tylenol as well and not help. Has taken Vicodin in past and it helps pain. Having cramping pain now, no sharp pain yet, the sharp pain will just "hit" and when it does its very intense.   No appointments to move [facility] sooner. Patient said she doesn't know what else to do. Please advise

## 2018-02-24 NOTE — Telephone Encounter (Signed)
Just FYI patient now scheduled on 02/28/18 @ 8:00 am, declined the tramadol Rx said she can't take it has tried and made her feel horrible. She will try to deal with discomfort until [facility] appointment on Monday.

## 2018-02-28 ENCOUNTER — Encounter: Payer: Self-pay | Admitting: Gynecology

## 2018-02-28 ENCOUNTER — Ambulatory Visit (INDEPENDENT_AMBULATORY_CARE_PROVIDER_SITE_OTHER): Payer: 59

## 2018-02-28 ENCOUNTER — Ambulatory Visit: Payer: 59 | Admitting: Gynecology

## 2018-02-28 VITALS — BP 118/76

## 2018-02-28 DIAGNOSIS — N950 Postmenopausal bleeding: Secondary | ICD-10-CM | POA: Diagnosis not present

## 2018-02-28 MED ORDER — PROGESTERONE MICRONIZED 100 MG PO CAPS: 100.0000 mg | ORAL_CAPSULE | Freq: Every day | ORAL | 0 refills | Status: DC

## 2018-02-28 NOTE — Progress Notes (Signed)
    SHERRILYN NAIRN 20-Nov-1960 [id number]        57 y.o.  [id number] presents for sonohysterogram.  History of menses like flow starting in April and she has bled on and off since then.  Over 2 years without menses proceeding this.  Saw [facility] where ultrasound showed fluid within the endometrium and a focal area encroaching the endometrium, questionable polyp.  Small 8 mm right ovarian cyst.  9 mm left ovarian cyst.  Past medical history,surgical history, problem list, medications, allergies, family history and social history were all reviewed and documented in the EPIC chart.  Directed ROS with pertinent positives and negatives documented in the history of present illness/assessment and plan.  Exam: [place] assistant BP 118/76 General appearance:  Normal Abdomen soft nontender without masses guarding rebound Pelvic external BUS vagina with light menses flow.  Cervix normal.  Uterus normal size midline mobile nontender.  Adnexa without masses or tenderness.  Ultrasound transvaginal shows uterus normal size and echotexture.  Endometrial echo 5.1 mm.  Continue presence of small posterior focus noted on prior ultrasound.  Right ovary continue presence of small cyst.  Left ovary normal without evidence of prior noted cyst.  Cul-de-sac negative.  Sonohysterogram performed, sterile technique, easy catheter introduction, good distention with no abnormality seen.  Echogenic area noted previously outside of the endometrial cavity.  Endometrial sample taken.  Patient tolerated well.  Assessment/Plan:  57 y.o. [id number] with postmenopausal bleeding.  Patient notes breast tenderness and bloating surrounding this bleeding episode.  I reviewed with the patient and her husband the differential to include escape ovulation, atrophic changes, hyperplasia/malignancy, polyps, myomas.  The small echogenic area noted on initial ultrasound is outside the endometrium consistent with probable small myoma.  I think the  most probable cause in this patient is escape ovulation.  She also notes that she was having hot flushes and sweats postmenopausally but these have gone away over the last several months again pointing towards ovarian activity.  Will check Section today.  Recommended progesterone withdrawal for now.  Patient notes sensitivity to hormones and that she could not take birth control pills because of emotional changes and not feeling well.  She used a vaginal estrogen for atrophic changes and dyspareunia and again noted emotional/not feeling well changes within days of starting the estrogen cream vaginally.  I discussed with her other options include expectant management or full hysteroscopy D&C to evacuate the uterus.  At this point she is going to try naturalized progesterone Prometrium 100 mg x 10 days as a withdrawal and see how she does with this.  She will follow-up for the biopsy results.  If she does not tolerate the Prometrium then we will hold on that and follow expectantly to see if the sonohysterogram biopsy affects her bleeding.    Anastasio Auerbach MD, 8:40 AM 02/28/2018

## 2018-02-28 NOTE — Patient Instructions (Signed)
Office will call you with biopsy results.  Take the Prometrium progesterone tablets at bedtime for 10 days.

## 2018-03-01 ENCOUNTER — Encounter: Payer: Self-pay | Admitting: *Deleted

## 2018-03-01 LAB — FOLLICLE STIMULATING HORMONE: FSH: 53.6 m[IU]/mL

## 2018-03-08 ENCOUNTER — Other Ambulatory Visit: Payer: 59

## 2018-03-08 ENCOUNTER — Ambulatory Visit: Payer: 59 | Admitting: Gynecology

## 2018-03-22 ENCOUNTER — Telehealth: Payer: Self-pay | Admitting: *Deleted

## 2018-03-22 NOTE — Telephone Encounter (Signed)
It may take a while for the uterus to resolve from the bleeding episode and discomfort from that.  Biopsy and ultrasound basically were all normal.  It did show that one small area that initially we thought could have been a polyp but on fluid filling the cavity did not appear to be within the cavity.  Even if it was within the cavity I would find it hard to believe that it would be causing her discomfort at it's small size.  If anything it would cause irregular bleeding.  My recommendation would be to watch it for now and hopefully it will slowly get better and resolve.  If it continues I guess I would start with a repeat ultrasound.  We could do a D&C with hysteroscopy recognizing though that we may not find anything.  The other issue is that the cramping may be GI related or other causes.  Let us watch for now since it is not overly significant pain wise and hopefully things will get better but certainly if not then call and we will pursue a more involved evaluation.

## 2018-03-22 NOTE — Telephone Encounter (Signed)
Patient called and left message, c/o discomfort. I called patient back and received her voicemail, left message for pt to call.

## 2018-03-22 NOTE — Telephone Encounter (Signed)
patient called to follow up from [place] 02/28/18 c/o daily cramping, finish progesterone 100 mg x 10days, no longer bleeding. Patient said cramping varies from sharp shooting to throbbing. States she doesn't know what else to do about the cramping, takes ibuprofen, I offered OV, she wants if having D&C will stop the cramping hopefully? States the cramping now is not bad, but still there.  Please advise

## 2018-03-23 NOTE — Telephone Encounter (Signed)
Patient informed with all the below.  

## 2018-04-05 ENCOUNTER — Other Ambulatory Visit: Payer: Self-pay | Admitting: Gastroenterology

## 2018-04-05 DIAGNOSIS — R1011 Right upper quadrant pain: Secondary | ICD-10-CM

## 2018-04-13 ENCOUNTER — Ambulatory Visit (HOSPITAL_COMMUNITY)
Admission: RE | Admit: 2018-04-13 | Discharge: 2018-04-13 | Disposition: A | Discharge status: Home / Self Care | Payer: 59 | Source: Ambulatory Visit | Attending: Gastroenterology | Admitting: Gastroenterology

## 2018-04-13 DIAGNOSIS — R1011 Right upper quadrant pain: Secondary | ICD-10-CM | POA: Diagnosis not present

## 2018-04-13 MED ORDER — TECHNETIUM TC 99M MEBROFENIN IV KIT
5.1700 | PACK | Freq: Once | INTRAVENOUS | Status: AC | PRN
  Administered 2018-04-13: 5.17 via INTRAVENOUS

## 2018-04-14 ENCOUNTER — Other Ambulatory Visit: Payer: Self-pay | Admitting: Gastroenterology

## 2018-04-14 DIAGNOSIS — R1033 Periumbilical pain: Secondary | ICD-10-CM

## 2018-04-14 DIAGNOSIS — R1011 Right upper quadrant pain: Secondary | ICD-10-CM

## 2018-04-19 ENCOUNTER — Ambulatory Visit: Payer: 59 | Admitting: Women's Health

## 2018-04-19 ENCOUNTER — Encounter: Payer: Self-pay | Admitting: Women's Health

## 2018-04-19 VITALS — BP 128/80 | Ht 63.0 in | Wt 145.0 lb

## 2018-04-19 DIAGNOSIS — Z01419 Encounter for gynecological examination (general) (routine) without abnormal findings: Secondary | ICD-10-CM | POA: Diagnosis not present

## 2018-04-19 NOTE — Progress Notes (Signed)
Yolanda Becker April 19, 1961 [id number]    History:    Presents for annual exam. History of normal mammograms ([facility]) and paps.  02/2018 postmenopausal bleeding with negative sonohysterogram by Dr. Phineas Becker,  small right ovarian cyst, left ovary normal.  No HRT, nightly hot flashes and vaginal dryness. Bilateral tubal ligation.  Complains of persistent generalized abdomen pain for about 1 and 1/2 years, mostly left lower abd, managed by GI. 12/2016 Negative gallbladder ultrasound and 2019 negative colonoscopy.  Follow up scheduled for abdomen CT.  Denies nausea, vomiting, diarrhea, or constipation.  Has had several bouts of shingles in the past year  Past medical history, past surgical history, family history and social history were all reviewed and documented in the EPIC chart.  History includes kidney stones, fibromyalgia, and hypertension. Has 3 adult children and 5 grandchildren. Father with colon cancer.Works as Customer service manager.   ROS:  A ROS was performed and pertinent positives and negatives are included.   Exam:  Vitals:   04/19/18 1558  BP: 128/80  Weight: 145 lb (65.8 kg)  Height: 5\' 3"  (1.6 m)   Body mass index is 25.69 kg/m.   General appearance:  Normal Thyroid:  Symmetrical, normal in size, without palpable masses or nodularity. Respiratory  Auscultation:  Clear without wheezing or rhonchi Cardiovascular  Auscultation:  Regular rate, without rubs, murmurs or gallops  Edema/varicosities:  Not grossly evident Abdominal  Soft without masses, guarding or rebound. LLQ tenderness with deep palpation.  Liver/spleen:  No organomegaly noted  Hernia:  None appreciated  Skin  Inspection:  Grossly normal   Breasts: Examined lying and sitting.     Right: Without masses, retractions, discharge or axillary adenopathy.     Left: Without masses, retractions, discharge or axillary adenopathy. Gentitourinary   Inguinal/mons:  Normal without inguinal adenopathy  External genitalia:   Normal  BUS/Urethra/Skene's glands:  Normal  Vagina:  Erythema  Cervix:  Normal  Uterus:  Normal in size, shape and contour.  Midline and mobile  Adnexa/parametria:     Rt: Without masses or tenderness.   Lt: Without masses or tenderness.  Anus and perineum: Normal  Digital rectal exam: Normal sphincter tone without palpated masses or tenderness  Assessment/Plan:  57 y.o.  MWF G5P3 for annual exam with complaint of vaginal dryness, hot flashes, and abdomen pain. Appears well.  Postmenopausal with no bleeding since 01/2017 and neg [facility],  no HRT, Abdomen pain, pending abdomen CT, managed by GI Fibromyalgia, hypertension- primary care manages labs and meds.   Plan: SBE's, continue annual mammograms. Pap. Urinalysis with culture. Continue water-based vaginal lubricant for vaginal dryness. Try vitamin E supplements twice daily for hot flashes.  Discuss healthy dieting, vitamin d 2,000, exercise encouraged.  Schedule follow-up with GI.  [name], 4:46 PM 04/19/2018

## 2018-04-19 NOTE — Patient Instructions (Signed)
Vit  E twice daily  Health Maintenance for Postmenopausal Women Menopause is a normal process in which your reproductive ability comes to an end. This process happens gradually over a span of months to years, usually between the ages of 74 and 77. Menopause is complete when you have missed 12 consecutive menstrual periods. It is important to talk with your health care provider about some of the most common conditions that affect postmenopausal women, such as heart disease, cancer, and bone loss (osteoporosis). Adopting a healthy lifestyle and getting preventive care can help to promote your health and wellness. Those actions can also lower your chances of developing some of these common conditions. What should I know about menopause? During menopause, you may experience a number of symptoms, such as:  Moderate-to-severe hot flashes.  Night sweats.  Decrease in sex drive.  Mood swings.  Headaches.  Tiredness.  Irritability.  Memory problems.  Insomnia.  Choosing to treat or not to treat menopausal changes is an individual decision that you make with your health care provider. What should I know about hormone replacement therapy and supplements? Hormone therapy products are effective for treating symptoms that are associated with menopause, such as hot flashes and night sweats. Hormone replacement carries certain risks, especially as you become older. If you are thinking about using estrogen or estrogen with progestin treatments, discuss the benefits and risks with your health care provider. What should I know about heart disease and stroke? Heart disease, heart attack, and stroke become more likely as you age. This may be due, in part, to the hormonal changes that your body experiences during menopause. These can affect how your body processes dietary fats, triglycerides, and cholesterol. Heart attack and stroke are both medical emergencies. There are many things that you can do to help  prevent heart disease and stroke:  Have your blood pressure checked at least every 1-2 years. High blood pressure causes heart disease and increases the risk of stroke.  If you are 55-44 years old, ask your health care provider if you should take aspirin to prevent a heart attack or a stroke.  Do not use any tobacco products, including cigarettes, chewing tobacco, or electronic cigarettes. If you need help quitting, ask your health care provider.  It is important to eat a healthy diet and maintain a healthy weight. ? Be sure to include plenty of vegetables, fruits, low-fat dairy products, and lean protein. ? Avoid eating foods that are high in solid fats, added sugars, or salt (sodium).  Get regular exercise. This is one of the most important things that you can do for your health. ? Try to exercise for at least 150 minutes each week. The type of exercise that you do should increase your heart rate and make you sweat. This is known as moderate-intensity exercise. ? Try to do strengthening exercises at least twice each week. Do these in addition to the moderate-intensity exercise.  Know your numbers.Ask your health care provider to check your cholesterol and your blood glucose. Continue to have your blood tested as directed by your health care provider.  What should I know about cancer screening? There are several types of cancer. Take the following steps to reduce your risk and to catch any cancer development as early as possible. Breast Cancer  Practice breast self-awareness. ? This means understanding how your breasts normally appear and feel. ? It also means doing regular breast self-exams. Let your health care provider know about any changes, no matter how  small.  If you are 40 or older, have a clinician do a breast exam (clinical breast exam or CBE) every year. Depending on your age, family history, and medical history, it may be recommended that you also have a yearly breast X-ray  (mammogram).  If you have a family history of breast cancer, talk with your health care provider about genetic screening.  If you are at high risk for breast cancer, talk with your health care provider about having an MRI and a mammogram every year.  Breast cancer (BRCA) gene test is recommended for women who have family members with BRCA-related cancers. Results of the assessment will determine the need for genetic counseling and BRCA1 and for BRCA2 testing. BRCA-related cancers include these types: ? Breast. This occurs in males or females. ? Ovarian. ? Tubal. This may also be called fallopian tube cancer. ? Cancer of the abdominal or pelvic lining (peritoneal cancer). ? Prostate. ? Pancreatic.  Cervical, Uterine, and Ovarian Cancer Your health care provider may recommend that you be screened regularly for cancer of the pelvic organs. These include your ovaries, uterus, and vagina. This screening involves a pelvic exam, which includes checking for microscopic changes to the surface of your cervix (Pap test).  For women ages 21-65, health care providers may recommend a pelvic exam and a Pap test every three years. For women ages 92-65, they may recommend the Pap test and pelvic exam, combined with testing for human papilloma virus (HPV), every five years. Some types of HPV increase your risk of cervical cancer. Testing for HPV may also be done on women of any age who have unclear Pap test results.  Other health care providers may not recommend any screening for nonpregnant women who are considered low risk for pelvic cancer and have no symptoms. Ask your health care provider if a screening pelvic exam is right for you.  If you have had past treatment for cervical cancer or a condition that could lead to cancer, you need Pap tests and screening for cancer for at least 20 years after your treatment. If Pap tests have been discontinued for you, your risk factors (such as having a new sexual  partner) need to be reassessed to determine if you should start having screenings again. Some women have medical problems that increase the chance of getting cervical cancer. In these cases, your health care provider may recommend that you have screening and Pap tests more often.  If you have a family history of uterine cancer or ovarian cancer, talk with your health care provider about genetic screening.  If you have vaginal bleeding after reaching menopause, tell your health care provider.  There are currently no reliable tests available to screen for ovarian cancer.  Lung Cancer Lung cancer screening is recommended for adults 39-53 years old who are at high risk for lung cancer because of a history of smoking. A yearly low-dose CT scan of the lungs is recommended if you:  Currently smoke.  Have a history of at least 30 pack-years of smoking and you currently smoke or have quit within the past 15 years. A pack-year is smoking an average of one pack of cigarettes per day for one year.  Yearly screening should:  Continue until it has been 15 years since you quit.  Stop if you develop a health problem that would prevent you from having lung cancer treatment.  Colorectal Cancer  This type of cancer can be detected and can often be prevented.  Routine  colorectal cancer screening usually begins at age 24 and continues through age 68.  If you have risk factors for colon cancer, your health care provider may recommend that you be screened at an earlier age.  If you have a family history of colorectal cancer, talk with your health care provider about genetic screening.  Your health care provider may also recommend using home test kits to check for hidden blood in your stool.  A small camera at the end of a tube can be used to examine your colon directly (sigmoidoscopy or colonoscopy). This is done to check for the earliest forms of colorectal cancer.  Direct examination of the colon  should be repeated every 5-10 years until age 41. However, if early forms of precancerous polyps or small growths are found or if you have a family history or genetic risk for colorectal cancer, you may need to be screened more often.  Skin Cancer  Check your skin from head to toe regularly.  Monitor any moles. Be sure to tell your health care provider: ? About any new moles or changes in moles, especially if there is a change in a mole's shape or color. ? If you have a mole that is larger than the size of a pencil eraser.  If any of your family members has a history of skin cancer, especially at a young age, talk with your health care provider about genetic screening.  Always use sunscreen. Apply sunscreen liberally and repeatedly throughout the day.  Whenever you are outside, protect yourself by wearing long sleeves, pants, a wide-brimmed hat, and sunglasses.  What should I know about osteoporosis? Osteoporosis is a condition in which bone destruction happens more quickly than new bone creation. After menopause, you may be at an increased risk for osteoporosis. To help prevent osteoporosis or the bone fractures that can happen because of osteoporosis, the following is recommended:  If you are 38-12 years old, get at least 1,000 mg of calcium and at least 600 mg of vitamin D per day.  If you are older than age 51 but younger than age 46, get at least 1,200 mg of calcium and at least 600 mg of vitamin D per day.  If you are older than age 43, get at least 1,200 mg of calcium and at least 800 mg of vitamin D per day.  Smoking and excessive alcohol intake increase the risk of osteoporosis. Eat foods that are rich in calcium and vitamin D, and do weight-bearing exercises several times each week as directed by your health care provider. What should I know about how menopause affects my mental health? Depression may occur at any age, but it is more common as you become older. Common symptoms of  depression include:  Low or sad mood.  Changes in sleep patterns.  Changes in appetite or eating patterns.  Feeling an overall lack of motivation or enjoyment of activities that you previously enjoyed.  Frequent crying spells.  Talk with your health care provider if you think that you are experiencing depression. What should I know about immunizations? It is important that you get and maintain your immunizations. These include:  Tetanus, diphtheria, and pertussis (Tdap) booster vaccine.  Influenza every year before the flu season begins.  Pneumonia vaccine.  Shingles vaccine.  Your health care provider may also recommend other immunizations. This information is not intended to replace advice given to you by your health care provider. Make sure you discuss any questions you have with your  health care provider. Document Released: 12/04/2005 Document Revised: 05/01/2016 Document Reviewed: 07/16/2015 Elsevier Interactive Patient Education  2018 [place] twice a day

## 2018-04-20 LAB — PAP IG W/ RFLX HPV ASCU

## 2018-04-21 LAB — URINALYSIS, COMPLETE W/RFL CULTURE
BILIRUBIN URINE: NEGATIVE
Bacteria, UA: NONE SEEN /HPF
GLUCOSE, UA: NEGATIVE
HGB URINE DIPSTICK: NEGATIVE
HYALINE CAST: NONE SEEN /LPF
Ketones, ur: NEGATIVE
Nitrites, Initial: NEGATIVE
PROTEIN: NEGATIVE
RBC / HPF: NONE SEEN /HPF (ref 0–2)
SQUAMOUS EPITHELIAL / LPF: NONE SEEN /HPF (ref <=5)
Specific Gravity, Urine: 1.007 (ref 1.001–1.03)
pH: 6 (ref 5.0–8.0)

## 2018-04-21 LAB — URINE CULTURE
MICRO NUMBER: 90762884
Result:: NO GROWTH
SPECIMEN QUALITY:: ADEQUATE

## 2018-04-21 LAB — CULTURE INDICATED

## 2018-05-03 ENCOUNTER — Ambulatory Visit
Admission: RE | Admit: 2018-05-03 | Discharge: 2018-05-03 | Disposition: A | Discharge status: Home / Self Care | Payer: 59 | Source: Ambulatory Visit | Attending: Gastroenterology | Admitting: Gastroenterology

## 2018-05-03 DIAGNOSIS — R1011 Right upper quadrant pain: Secondary | ICD-10-CM

## 2018-05-03 DIAGNOSIS — R1033 Periumbilical pain: Secondary | ICD-10-CM

## 2018-05-03 MED ORDER — IOPAMIDOL (ISOVUE-300) INJECTION 61%
100.0000 mL | Freq: Once | INTRAVENOUS | Status: AC | PRN
  Administered 2018-05-03: 100 mL via INTRAVENOUS

## 2019-07-25 ENCOUNTER — Encounter: Payer: Self-pay | Admitting: Gynecology

## 2020-01-04 ENCOUNTER — Ambulatory Visit: Payer: 59 | Attending: Internal Medicine

## 2020-01-04 DIAGNOSIS — Z23 Encounter for immunization: Secondary | ICD-10-CM

## 2020-01-04 NOTE — Progress Notes (Signed)
   Covid-19 Vaccination Clinic  Name:  Yolanda Becker    MRN: [id number] DOB: 02/11/1961  01/04/2020  [name] was observed post Covid-19 immunization for 15 minutes without incident. She was provided with Vaccine Information Sheet and instruction to access the V-Safe system.   [name] was instructed to call 911 with any severe reactions post vaccine: Marland Kitchen Difficulty breathing  . Swelling of face and throat  . A fast heartbeat  . A bad rash all over body  . Dizziness and weakness   Immunizations Administered    Name Date Dose VIS Date Route   Moderna COVID-19 Vaccine 01/04/2020  9:58 AM 0.5 mL 09/26/2019 Intramuscular   Manufacturer: Moderna   Lot: [id number]   [place]:[id number]

## 2020-02-07 ENCOUNTER — Ambulatory Visit: Payer: 59 | Attending: Internal Medicine

## 2020-02-07 DIAGNOSIS — Z23 Encounter for immunization: Secondary | ICD-10-CM

## 2020-02-07 NOTE — Progress Notes (Signed)
   Covid-19 Vaccination Clinic  Name:  Yolanda Becker    MRN: [id number] DOB: 09-27-1961  02/07/2020  [name] was observed post Covid-19 immunization for 15 minutes without incident. She was provided with Vaccine Information Sheet and instruction to access the V-Safe system.   [name] was instructed to call 911 with any severe reactions post vaccine: Marland Kitchen Difficulty breathing  . Swelling of face and throat  . A fast heartbeat  . A bad rash all over body  . Dizziness and weakness   Immunizations Administered    Name Date Dose VIS Date Route   Moderna COVID-19 Vaccine 02/07/2020  9:08 AM 0.5 mL 09/26/2019 Intramuscular   Manufacturer: Moderna   Lot: [id number]   [place][id number]

## 2020-04-02 IMAGING — CT CT ABD-PELV W/ CM
1 of 3 series · 14 of 32 positions shown, 19 images · IV contrast (APPLIED)
Comparison: 02/28/2018 pelvic ultrasound; report not available.
Prior CT 12/09/2016.

CLINICAL DATA: Right upper quadrant and periumbilical pain for 1-2
years. Occasional constipation. Vaginal bleeding. History renal
stone and tubal ligation.

EXAM:
CT ABDOMEN AND PELVIS WITH CONTRAST
TECHNIQUE: Multidetector CT imaging of the abdomen and pelvis was performed
using the standard protocol following bolus administration of
intravenous contrast.
CONTRAST:  100mL C4SCXO-P66 IOPAMIDOL (C4SCXO-P66) INJECTION 61%

[Series 2: abd/pelvis w/cm · axial · 0.68mm/px · z∈[-474,-49]mm · 14 of 99 slices shown, 19 images]
[im 7/99  soft-tissue]
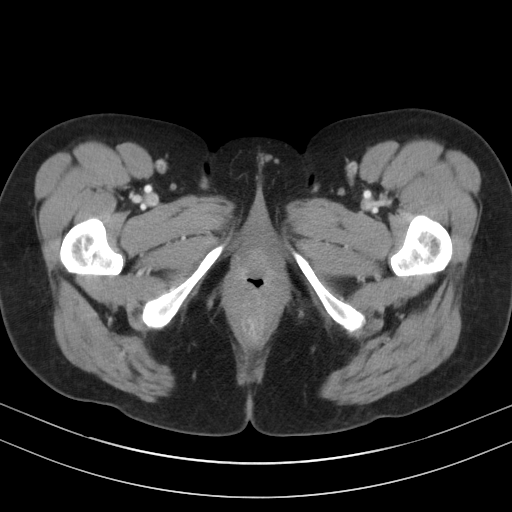
[im 7/99  bone]
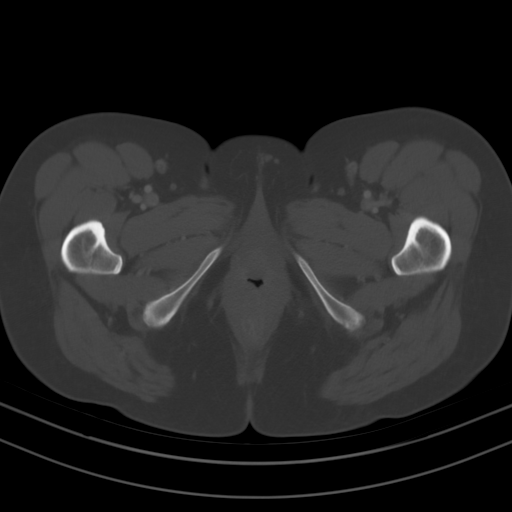
[im 13/99  soft-tissue]
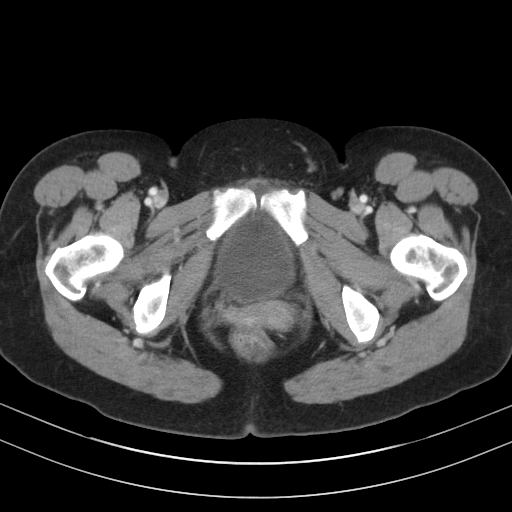
[im 19/99  soft-tissue]
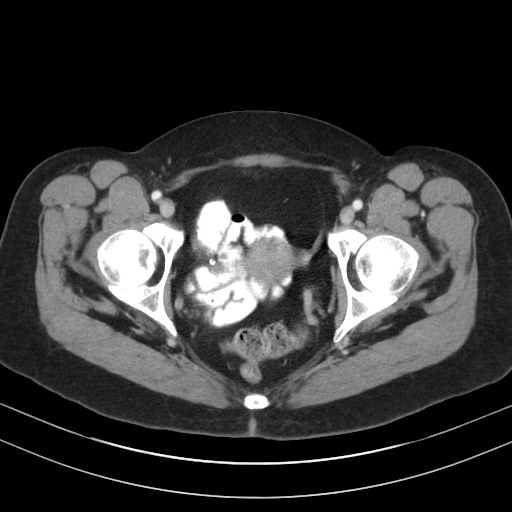
[im 31/99  soft-tissue]
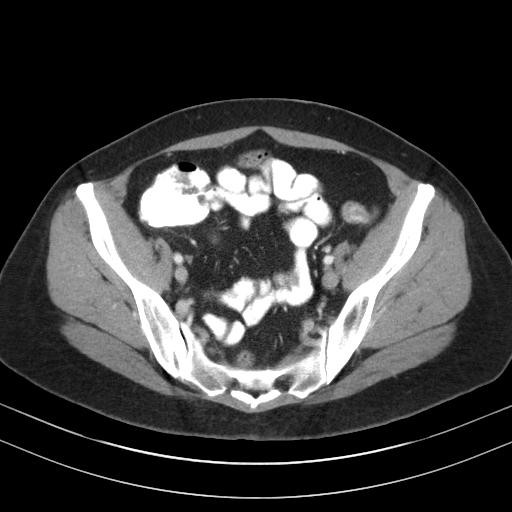
[im 37/99  soft-tissue]
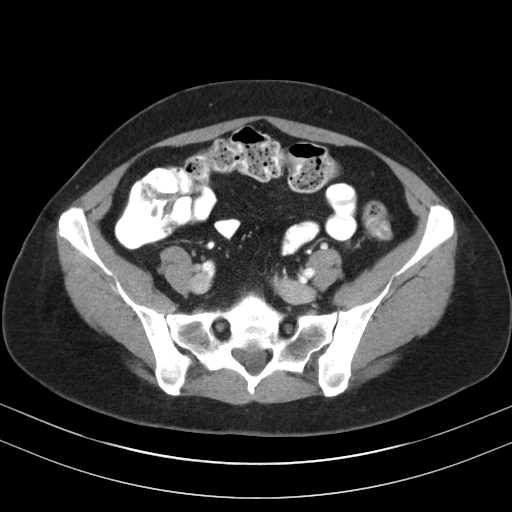
[im 43/99  soft-tissue]
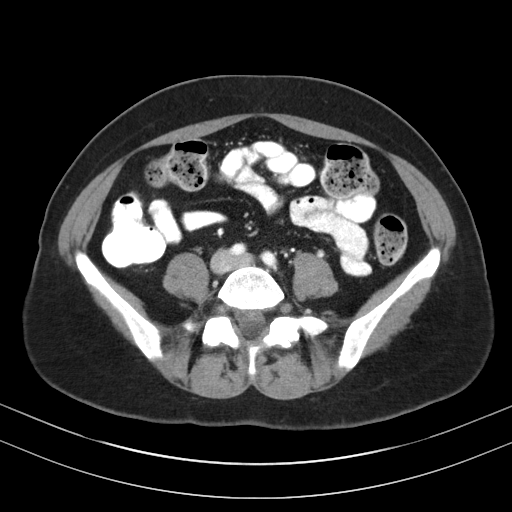
[im 50/99  soft-tissue]
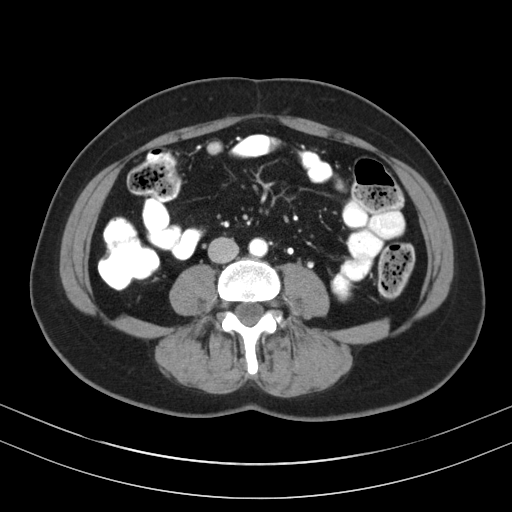
[im 56/99  soft-tissue]
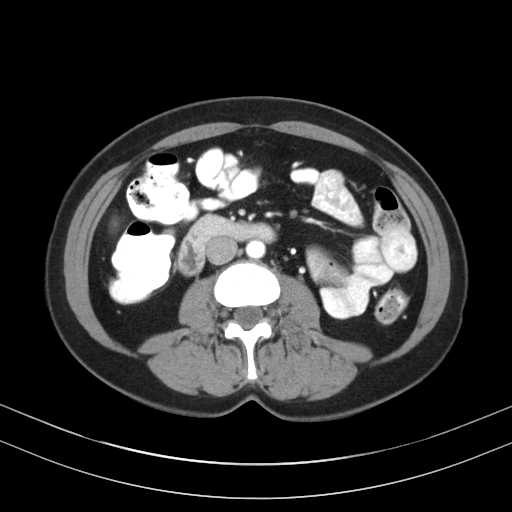
[im 62/99  soft-tissue]
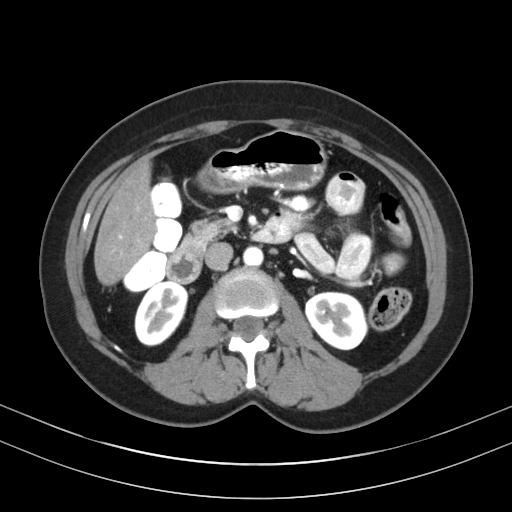
[im 62/99  bone]
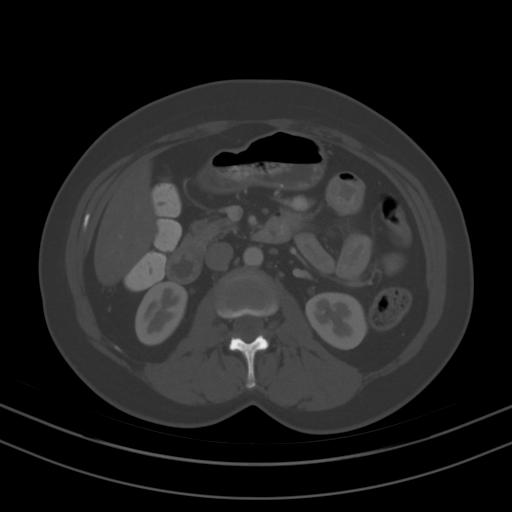
[im 68/99  soft-tissue]
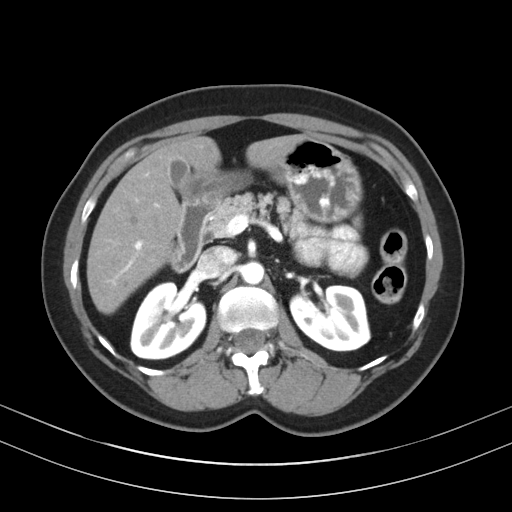
[im 74/99  lung]
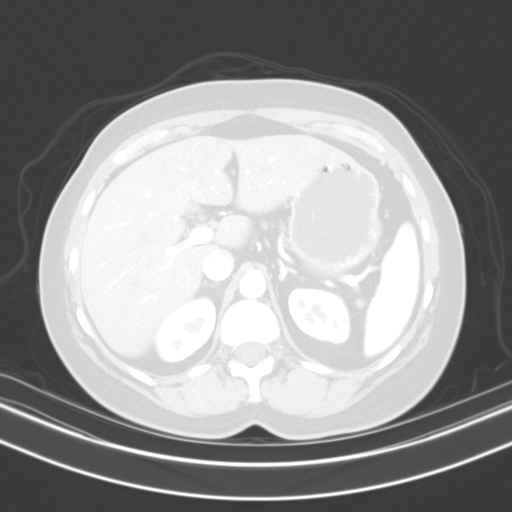
[im 80/99  soft-tissue]
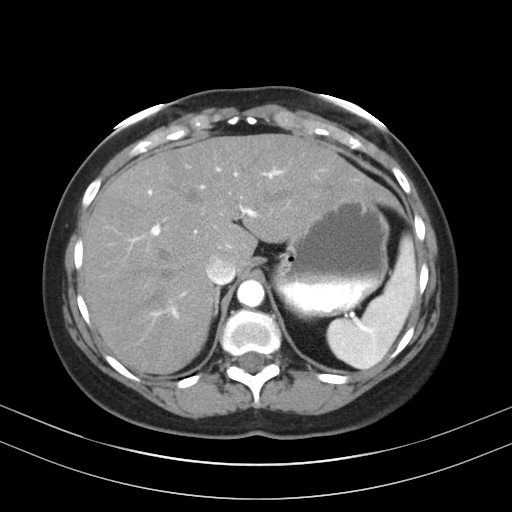
[im 80/99  lung]
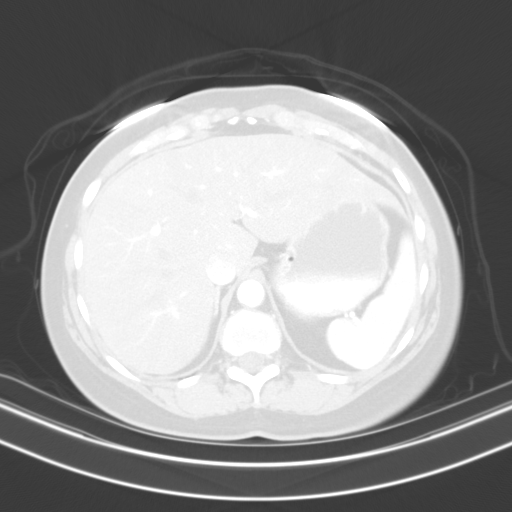
[im 86/99  soft-tissue]
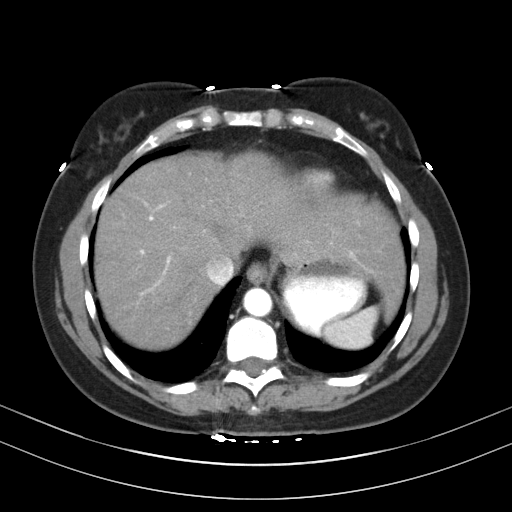
[im 86/99  lung]
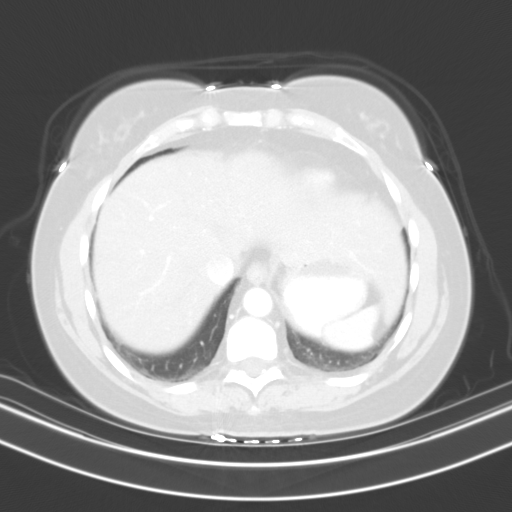
[im 92/99  soft-tissue]
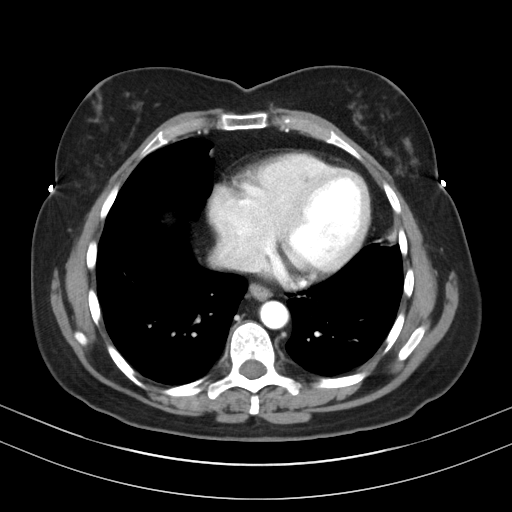
[im 92/99  lung]
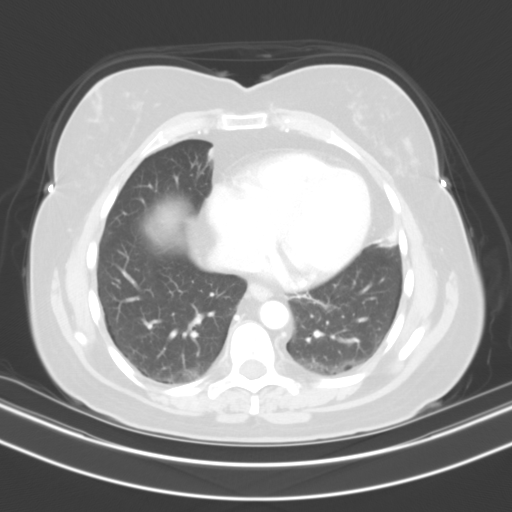

[14 of 32 positions shown; findings below may reference images not displayed]

FINDINGS: Lower chest: 5 mm right lower lobe pulmonary nodule on [DATE] was
present on 12/09/2016 and can be presumed benign. Lingular volume
loss and scar. Mild cardiomegaly, without pericardial or pleural
effusion.

Hepatobiliary: Mild to moderate hepatic steatosis. Variant lateral
segment left liver lobe extending in the left upper quadrant. Normal
gallbladder, without biliary ductal dilatation.

Pancreas: Normal, without mass or ductal dilatation.

Spleen: Normal in size, without focal abnormality.

Adrenals/Urinary Tract: Normal adrenal glands. Normal kidneys,
without hydronephrosis. Normal urinary bladder.

Stomach/Bowel: Normal stomach, without wall thickening. Normal colon
and terminal ileum. Normal diminutive appendix on 75/2. Normal small
bowel.

Vascular/Lymphatic: Aortic atherosclerosis. No abdominopelvic
adenopathy.

Reproductive: Normal uterus and adnexa.

Other: No significant free fluid.  Mild pelvic floor laxity.

Musculoskeletal: Disc bulge at the lumbosacral junction
IMPRESSION: 1.  No acute process in the abdomen or pelvis.
2. Hepatic steatosis.
3.  Aortic Atherosclerosis (OIV4B-GS1.1).

## 2020-07-25 ENCOUNTER — Ambulatory Visit (INDEPENDENT_AMBULATORY_CARE_PROVIDER_SITE_OTHER): Payer: 59 | Admitting: Nurse Practitioner

## 2020-07-25 ENCOUNTER — Encounter: Payer: Self-pay | Admitting: Nurse Practitioner

## 2020-07-25 ENCOUNTER — Other Ambulatory Visit: Payer: Self-pay

## 2020-07-25 VITALS — BP 122/80 | Ht 63.0 in | Wt 142.0 lb

## 2020-07-25 DIAGNOSIS — Z01419 Encounter for gynecological examination (general) (routine) without abnormal findings: Secondary | ICD-10-CM | POA: Diagnosis not present

## 2020-07-25 DIAGNOSIS — N951 Menopausal and female climacteric states: Secondary | ICD-10-CM | POA: Diagnosis not present

## 2020-07-25 DIAGNOSIS — N941 Unspecified dyspareunia: Secondary | ICD-10-CM

## 2020-07-25 NOTE — Progress Notes (Signed)
   Yolanda Becker 08-06-1961 [id number]   History:  59 y.o. G2P0023 presents for annual exam. Postmenopausal - no HRT, no bleeding. Normal pap and mammogram history. 02/2018 postmenopausal  Bleeding with negative sonohysterogram other than small right ovarian cyst. BTL. She does complain of pain with intercourse. She tried vaginal estrogen but did not tolerate well as she is sensitive to hormones. She also reacts to OTC lubricants due to skin sensitivity. Persistent abdominal pain followed by GI.   Gynecologic History Patient's last menstrual period was 11/14/2012.   Contraception: post menopausal status Last Pap: 04/19/2018. Results were: normal Last mammogram: 12/08/2019. Results were: normal Last colonoscopy: 03/07/2018. Results were: normal, 5 year follow up    Past medical history, past surgical history, family history and social history were all reviewed and documented in the EPIC chart.  ROS:  A ROS was performed and pertinent positives and negatives are included.  Exam:  Vitals:   07/25/20 1532  BP: 122/80  Weight: 142 lb (64.4 kg)  Height: 5\' 3"  (1.6 m)   Body mass index is 25.15 kg/m.  General appearance:  Normal Thyroid:  Symmetrical, normal in size, without palpable masses or nodularity. Respiratory  Auscultation:  Clear without wheezing or rhonchi Cardiovascular  Auscultation:  Regular rate, without rubs, murmurs or gallops  Edema/varicosities:  Not grossly evident Abdominal  Soft,nontender, without masses, guarding or rebound.  Liver/spleen:  No organomegaly noted  Hernia:  None appreciated  Skin  Inspection:  Grossly normal   Breasts: Examined lying and sitting.   Right: Without masses, retractions, discharge or axillary adenopathy.   Left: Without masses, retractions, discharge or axillary adenopathy. Gentitourinary   Inguinal/mons:  Normal without inguinal adenopathy  External genitalia:  Normal  BUS/Urethra/Skene's glands:  Normal  Vagina:  Atrophic  changes  Cervix:  Normal  Uterus:  Normal in size, shape and contour.  Midline and mobile  Adnexa/parametria:     Rt: Without masses or tenderness.   Lt: Without masses or tenderness.  Anus and perineum: Normal  Digital rectal exam: Normal sphincter tone without palpated masses or tenderness  Assessment/Plan:  59 y.o. [id number] for annual exam.   Well female exam with routine gynecological exam - Education provided on SBEs, importance of preventative screenings, current guidelines, high calcium diet, regular exercise, and multivitamin daily. Labs with primary care.   Dyspareunia in female - due to vaginal dryness  Menopausal vaginal dryness - unable to tolerate OTC lubricants or estrogen creams. Recommend coconut oil.  Follow up in 1 year for annual.       Tamela Gammon [facility], 3:36 PM 07/25/2020

## 2020-07-25 NOTE — Patient Instructions (Signed)
Health Maintenance After Age 58 After age 58, you are at a higher risk for certain long-term diseases and infections as well as injuries from falls. Falls are a major cause of broken bones and head injuries in people who are older than age 58. Getting regular preventive care can help to keep you healthy and well. Preventive care includes getting regular testing and making lifestyle changes as recommended by your health care provider. Talk with your health care provider about:  Which screenings and tests you should have. A screening is a test that checks for a disease when you have no symptoms.  A diet and exercise plan that is right for you. What should I know about screenings and tests to prevent falls? Screening and testing are the best ways to find a health problem early. Early diagnosis and treatment give you the best chance of managing medical conditions that are common after age 58. Certain conditions and lifestyle choices may make you more likely to have a fall. Your health care provider may recommend:  Regular vision checks. Poor vision and conditions such as cataracts can make you more likely to have a fall. If you wear glasses, make sure to get your prescription updated if your vision changes.  Medicine review. Work with your health care provider to regularly review all of the medicines you are taking, including over-the-counter medicines. Ask your health care provider about any side effects that may make you more likely to have a fall. Tell your health care provider if any medicines that you take make you feel dizzy or sleepy.  Osteoporosis screening. Osteoporosis is a condition that causes the bones to get weaker. This can make the bones weak and cause them to break more easily.  Blood pressure screening. Blood pressure changes and medicines to control blood pressure can make you feel dizzy.  Strength and balance checks. Your health care provider may recommend certain tests to check your  strength and balance while standing, walking, or changing positions.  Foot health exam. Foot pain and numbness, as well as not wearing proper footwear, can make you more likely to have a fall.  Depression screening. You may be more likely to have a fall if you have a fear of falling, feel emotionally low, or feel unable to do activities that you used to do.  Alcohol use screening. Using too much alcohol can affect your balance and may make you more likely to have a fall. What actions can I take to lower my risk of falls? General instructions  Talk with your health care provider about your risks for falling. Tell your health care provider if: ? You fall. Be sure to tell your health care provider about all falls, even ones that seem minor. ? You feel dizzy, sleepy, or off-balance.  Take over-the-counter and prescription medicines only as told by your health care provider. These include any supplements.  Eat a healthy diet and maintain a healthy weight. A healthy diet includes low-fat dairy products, low-fat (lean) meats, and fiber from whole grains, beans, and lots of fruits and vegetables. Home safety  Remove any tripping hazards, such as rugs, cords, and clutter.  Install safety equipment such as grab bars in bathrooms and safety rails on stairs.  Keep rooms and walkways well-lit. Activity   Follow a regular exercise program to stay fit. This will help you maintain your balance. Ask your health care provider what types of exercise are appropriate for you.  If you need a cane or   walker, use it as recommended by your health care provider.  Wear supportive shoes that have nonskid soles. Lifestyle  Do not drink alcohol if your health care provider tells you not to drink.  If you drink alcohol, limit how much you have: ? 0-1 drink a day for women. ? 0-2 drinks a day for men.  Be aware of how much alcohol is in your drink. In the [place] one drink equals one typical bottle of beer (12  oz), one-half glass of wine (5 oz), or one shot of hard liquor (1 oz).  Do not use any products that contain nicotine or tobacco, such as cigarettes and e-cigarettes. If you need help quitting, ask your health care provider. Summary  Having a healthy lifestyle and getting preventive care can help to protect your health and wellness after age 58.  Screening and testing are the best way to find a health problem early and help you avoid having a fall. Early diagnosis and treatment give you the best chance for managing medical conditions that are more common for people who are older than age 58.  Falls are a major cause of broken bones and head injuries in people who are older than age 58. Take precautions to prevent a fall at home.  Work with your health care provider to learn what changes you can make to improve your health and wellness and to prevent falls. This information is not intended to replace advice given to you by your health care provider. Make sure you discuss any questions you have with your health care provider. Document Revised: 02/02/2019 Document Reviewed: 08/25/2017 Elsevier Patient Education  2020 [employer]  

## 2021-09-25 HISTORY — PX: MOHS SURGERY: SUR867

## 2021-10-29 ENCOUNTER — Ambulatory Visit (INDEPENDENT_AMBULATORY_CARE_PROVIDER_SITE_OTHER): Payer: 59 | Admitting: Nurse Practitioner

## 2021-10-29 ENCOUNTER — Other Ambulatory Visit: Payer: Self-pay

## 2021-10-29 ENCOUNTER — Other Ambulatory Visit (HOSPITAL_COMMUNITY)
Admission: RE | Admit: 2021-10-29 | Discharge: 2021-10-29 | Disposition: A | Discharge status: Home / Self Care | Payer: 59 | Source: Ambulatory Visit | Attending: Nurse Practitioner | Admitting: Nurse Practitioner

## 2021-10-29 ENCOUNTER — Encounter: Payer: Self-pay | Admitting: Nurse Practitioner

## 2021-10-29 VITALS — BP 112/74 | HR 72 | Ht 62.5 in | Wt 144.0 lb

## 2021-10-29 DIAGNOSIS — N951 Menopausal and female climacteric states: Secondary | ICD-10-CM | POA: Diagnosis not present

## 2021-10-29 DIAGNOSIS — Z01419 Encounter for gynecological examination (general) (routine) without abnormal findings: Secondary | ICD-10-CM | POA: Diagnosis not present

## 2021-10-29 DIAGNOSIS — Z780 Asymptomatic menopausal state: Secondary | ICD-10-CM

## 2021-10-29 DIAGNOSIS — Z23 Encounter for immunization: Secondary | ICD-10-CM | POA: Diagnosis not present

## 2021-10-29 MED ORDER — NONFORMULARY OR COMPOUNDED ITEM: 4 refills | Status: DC

## 2021-10-29 NOTE — Progress Notes (Signed)
Yolanda Becker [birth year] [id number]   History:  61 y.o. G2P0023 presents for annual exam. Postmenopausal - no HRT, no bleeding. Normal pap and mammogram history. She does complain of pain with intercourse. She tried vaginal estrogen but did not tolerate. She also reacts to OTC lubricants due to skin sensitivity. HTN managed by PCP, persistent abdominal pain followed by GI. 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   Gynecologic History Patient's last menstrual period was 11/14/2012.   Contraception: post menopausal status Sexually active: Yes  Health maintenance Last Pap: 04/19/2018. Results were: Normal Last mammogram: 01/2021. Results were: Normal per patient Last colonoscopy: 03/07/2018. Results were: Normal, 5 year recall  Last Dexa: Not indicated  Past medical history, past surgical history, family history and social history were all reviewed and documented in the EPIC chart. Father with history of colon cancer.   ROS:  A ROS was performed and pertinent positives and negatives are included.  Exam:  Vitals:   10/29/21 1403  BP: 112/74  Pulse: 72  SpO2: 97%  Weight: 144 lb (65.3 kg)  Height: 5' 2.5" (1.588 m)    Body mass index is 25.92 kg/m.  General appearance:  Normal Thyroid:  Symmetrical, normal in size, without palpable masses or nodularity. Respiratory  Auscultation:  Clear without wheezing or rhonchi Cardiovascular  Auscultation:  Regular rate, without rubs, murmurs or gallops  Edema/varicosities:  Not grossly evident Abdominal  Soft,nontender, without masses, guarding or rebound.  Liver/spleen:  No organomegaly noted  Hernia:  None appreciated  Skin  Inspection:  Grossly normal   Breasts: Examined lying and sitting.   Right: Without masses, retractions, discharge or axillary adenopathy.   Left: Without masses, retractions, discharge or axillary adenopathy. Genitourinary   Inguinal/mons:  Normal without inguinal  adenopathy  External genitalia:  Normal appearing vulva with no masses, tenderness, or lesions  BUS/Urethra/Skene's glands:  Normal  Vagina:  Normal appearing with normal color and discharge, no lesions  Cervix:  Normal appearing without discharge or lesions. Atrophic changes  Uterus:  Normal in size, shape and contour.  Midline and mobile, nontender  Adnexa/parametria:     Rt: Normal in size, without masses or tenderness.   Lt: Normal in size, without masses or tenderness.  Anus and perineum: Normal  Digital rectal exam: Normal sphincter tone without palpated masses or tenderness  Patient informed chaperone available to be present for breast and pelvic exam. Patient has requested no chaperone to be present. Patient has been advised what will be completed during breast and pelvic exam.   Assessment/Plan:  61 y.o. [id number] for annual exam.   Well female exam with routine gynecological exam - Education provided on SBEs, importance of preventative screenings, current guidelines, high calcium diet, regular exercise, and multivitamin daily. Labs with PCP.   Postmenopausal - no HRT, no bleeding  Menopausal vaginal dryness - Mostly with intercourse and will have pain days following. She has tried vaginal estrogen but did not tolerate. She also reacts to OTC lubricants due to skin sensitivity. We discussed option to try Vitamin E suppositories and coconut oil externally and with intercourse. If unable to tolerate Vitamin E or it is costly, she has been provided with Revaree coupon and instructions for use.   Need for immunization against influenza - Plan: Flu Vaccine QUAD 68mo+IM (Fluarix, Fluzone & Alfiuria Quad PF)  Screening for cervical cancer - Normal Pap history.  Pap today.  Screening for breast cancer - Normal mammogram history.  Continue annual screenings.  Normal breast exam today.  Screening for colon cancer - 2019 colonoscopy. Will repeat at 5-year interval per GI's recommendation.    Screening for osteoporosis - Average risk. Will plan for DXA at age 61.   Follow up in 1 year for annual.       Tamela Gammon [facility], 2:58 PM 10/29/2021

## 2021-10-31 LAB — CYTOLOGY - PAP: Diagnosis: NEGATIVE

## 2022-06-13 ENCOUNTER — Encounter (HOSPITAL_COMMUNITY): Payer: Self-pay | Admitting: *Deleted

## 2022-06-13 ENCOUNTER — Emergency Department (HOSPITAL_COMMUNITY)
Admission: EM | Admit: 2022-06-13 | Discharge: 2022-06-13 | Disposition: A | Discharge status: Home / Self Care | Payer: 59 | Attending: Emergency Medicine | Admitting: Emergency Medicine

## 2022-06-13 ENCOUNTER — Other Ambulatory Visit: Payer: Self-pay

## 2022-06-13 DIAGNOSIS — Z79899 Other long term (current) drug therapy: Secondary | ICD-10-CM | POA: Diagnosis not present

## 2022-06-13 DIAGNOSIS — R197 Diarrhea, unspecified: Secondary | ICD-10-CM | POA: Diagnosis present

## 2022-06-13 DIAGNOSIS — R112 Nausea with vomiting, unspecified: Secondary | ICD-10-CM | POA: Diagnosis not present

## 2022-06-13 DIAGNOSIS — E871 Hypo-osmolality and hyponatremia: Secondary | ICD-10-CM | POA: Insufficient documentation

## 2022-06-13 DIAGNOSIS — R1084 Generalized abdominal pain: Secondary | ICD-10-CM | POA: Insufficient documentation

## 2022-06-13 DIAGNOSIS — A09 Infectious gastroenteritis and colitis, unspecified: Secondary | ICD-10-CM

## 2022-06-13 DIAGNOSIS — R5383 Other fatigue: Secondary | ICD-10-CM | POA: Diagnosis not present

## 2022-06-13 DIAGNOSIS — I10 Essential (primary) hypertension: Secondary | ICD-10-CM | POA: Insufficient documentation

## 2022-06-13 LAB — URINALYSIS, ROUTINE W REFLEX MICROSCOPIC
Bacteria, UA: NONE SEEN
Bilirubin Urine: NEGATIVE
Glucose, UA: NEGATIVE mg/dL
Ketones, ur: 20 mg/dL — AB
Leukocytes,Ua: NEGATIVE
Nitrite: NEGATIVE
Protein, ur: NEGATIVE mg/dL
Specific Gravity, Urine: 1.006 (ref 1.005–1.030)
pH: 6 (ref 5.0–8.0)

## 2022-06-13 LAB — COMPREHENSIVE METABOLIC PANEL
ALT: 30 U/L (ref 0–44)
AST: 28 U/L (ref 15–41)
Albumin: 3.3 g/dL — ABNORMAL LOW (ref 3.5–5.0)
Alkaline Phosphatase: 111 U/L (ref 38–126)
Anion gap: 10 (ref 5–15)
BUN: 12 mg/dL (ref 6–20)
CO2: 23 mmol/L (ref 22–32)
Calcium: 9 mg/dL (ref 8.9–10.3)
Chloride: 96 mmol/L — ABNORMAL LOW (ref 98–111)
Creatinine, Ser: 0.92 mg/dL (ref 0.44–1.00)
GFR, Estimated: >60 mL/min (ref >60)
Glucose, Bld: 102 mg/dL — ABNORMAL HIGH (ref 70–99)
Potassium: 3.6 mmol/L (ref 3.5–5.1)
Sodium: 129 mmol/L — ABNORMAL LOW (ref 135–145)
Total Bilirubin: 0.4 mg/dL (ref 0.3–1.2)
Total Protein: 7 g/dL (ref 6.5–8.1)

## 2022-06-13 LAB — CBC WITH DIFFERENTIAL/PLATELET
Abs Immature Granulocytes: 0.05 10*3/uL (ref 0.00–0.07)
Basophils Absolute: 0 10*3/uL (ref 0.0–0.1)
Basophils Relative: 0 %
Eosinophils Absolute: 0 10*3/uL (ref 0.0–0.5)
Eosinophils Relative: 1 %
HCT: 37.6 % (ref 36.0–46.0)
Hemoglobin: 12.6 g/dL (ref 12.0–15.0)
Immature Granulocytes: 1 %
Lymphocytes Relative: 17 %
Lymphs Abs: 1.4 10*3/uL (ref 0.7–4.0)
MCH: 28.4 pg (ref 26.0–34.0)
MCHC: 33.5 g/dL (ref 30.0–36.0)
MCV: 84.7 fL (ref 80.0–100.0)
Monocytes Absolute: 1.8 10*3/uL — ABNORMAL HIGH (ref 0.1–1.0)
Monocytes Relative: 23 %
Neutro Abs: 4.8 10*3/uL (ref 1.7–7.7)
Neutrophils Relative %: 58 %
Platelets: 210 10*3/uL (ref 150–400)
RBC: 4.44 MIL/uL (ref 3.87–5.11)
RDW: 13.2 % (ref 11.5–15.5)
WBC: 8.1 10*3/uL (ref 4.0–10.5)
nRBC: 0 % (ref 0.0–0.2)

## 2022-06-13 LAB — LIPASE, BLOOD: Lipase: 64 U/L — ABNORMAL HIGH (ref 11–51)

## 2022-06-13 MED ORDER — SODIUM CHLORIDE 0.9 % IV BOLUS
1000.0000 mL | Freq: Once | INTRAVENOUS | Status: AC
  Administered 2022-06-13: 1000 mL via INTRAVENOUS

## 2022-06-13 MED ORDER — DIPHENOXYLATE-ATROPINE 2.5-0.025 MG PO TABS
2.0000 | ORAL_TABLET | Freq: Once | ORAL | Status: AC
  Administered 2022-06-13: 2 via ORAL
  Filled 2022-06-13: qty 2

## 2022-06-13 MED ORDER — SODIUM CHLORIDE 0.9 % IV SOLN
12.5000 mg | Freq: Once | INTRAVENOUS | Status: AC
  Administered 2022-06-13: 12.5 mg via INTRAVENOUS
  Filled 2022-06-13 (×2): qty 0.5

## 2022-06-13 MED ORDER — ONDANSETRON HCL 4 MG PO TABS: 4.0000 mg | ORAL_TABLET | Freq: Three times a day (TID) | ORAL | 0 refills | Status: AC | PRN

## 2022-06-13 MED ORDER — DIPHENOXYLATE-ATROPINE 2.5-0.025 MG PO TABS: 1.0000 | ORAL_TABLET | Freq: Four times a day (QID) | ORAL | 0 refills | Status: AC | PRN

## 2022-06-13 MED ORDER — ONDANSETRON HCL 4 MG PO TABS: 4.0000 mg | ORAL_TABLET | Freq: Three times a day (TID) | ORAL | 0 refills | Status: DC | PRN

## 2022-06-13 NOTE — ED Triage Notes (Signed)
Pt states she has had diarrhea x 5 days; pt states she was in [place] and just came back last Saturday and started having diarrhea with chills and body aches on Monday; pt has been to her PCP and urgent care with no relief  Pt states she has not been able to eat or drink since Monday

## 2022-06-13 NOTE — Discharge Instructions (Addendum)
Rest make sure you are drinking plenty of fluids.  The liquid IV may help.  I also recommend trying the Zofran in place of your home Phenergan which should not make you so sleepy.  Use the Lomotil as prescribed if needed for continued diarrhea.  Be aware that this medication will probably make you drowsy.  I also recommend the BRAT diet as discussed for the next several days, then increasing food intake as tolerated.  Your labs today are reassuring, your lipase as discussed is slightly elevated, possibly related to your alcohol intake during your vacation, but could also be secondary to today's symptoms.  You do not need to have this blood retested unless you have persistent or worsening symptoms, I encourage you following up with your primary doctor and let her recheck your lipase if your symptoms are not resolving with today's treatment plan.

## 2022-06-13 NOTE — ED Provider Notes (Signed)
[facility] EMERGENCY DEPARTMENT Provider Note   CSN: [id number] Arrival date & time: 06/13/22  1022     History  Chief Complaint  Patient presents with   Diarrhea    Yolanda Becker is a 61 y.o. female with history including hypertension, fibromyalgia, history of kidney stones, no significant surgical history presenting with a 5-day history of nausea, vomiting and diarrhea.  She returned from a vacation to [place] 7 days ago where she and her husband stayed at a resort in [place].  2 days after returning home she developed her symptoms, no bloody emesis, no blood in her stools.  She has been unable to keep any fluid or solid food down except for small sips of water and ginger ale.  She also endorses low-grade fever with Tmax of 100.5.  She describes abdominal cramping and bloating.  She has had no dysuria and has been urinating regularly.  She was seen by her PCP 3 days ago and was found to be negative for COVID and influenza.  She was then seen at an urgent care in [facility] 2 days ago at which time they collected stool samples which have not yet resulted but was empirically placed on a 5-day course of Levaquin.  She was also prescribed Phenergan, her last dose was taken yesterday evening.  She continues to have frequent diarrhea despite doses of Imodium as well.  She endorses generalized fatigue.  She states she and her husband drink mostly bottled water on their trip, except when in restaurants.  Her husband is symptom-free.  The history is provided by the patient.       Home Medications Prior to Admission medications   Medication Sig Start Date End Date Taking? Authorizing Provider  acetaminophen (TYLENOL) 500 MG tablet Take 500 mg by mouth every 6 (six) hours as needed for mild pain.   Yes [provider]  BYSTOLIC 5 MG tablet Take 1 tablet by mouth daily. 01/20/18  Yes [provider]  diphenoxylate-atropine (LOMOTIL) 2.5-0.025 MG tablet Take 1 tablet by mouth 4  (four) times daily as needed for diarrhea or loose stools. 06/13/22  Yes Laiba Fuerte, Almyra Free, PA-C  esomeprazole (NEXIUM) 20 MG capsule Take 20 mg by mouth daily at 12 noon.   Yes [provider]  fexofenadine (ALLEGRA) 60 MG tablet Take 60 mg by mouth daily.   Yes [provider]  gabapentin (NEURONTIN) 300 MG capsule Take 300 mg by mouth daily. 11/11/20  Yes [provider]  levofloxacin (LEVAQUIN) 500 MG tablet Take 500 mg by mouth daily. Starting 08.18.23 x 5 days 06/12/22  Yes [provider]  losartan-hydrochlorothiazide (HYZAAR) 50-12.5 MG tablet Take 1 tablet by mouth daily.   Yes [provider]  Lysine 500 MG TABS Take 1 tablet by mouth daily.   Yes [provider]  ondansetron (ZOFRAN) 4 MG tablet Take 1 tablet (4 mg total) by mouth every 8 (eight) hours as needed for nausea or vomiting. 06/13/22  Yes Jacoby Ritsema, Almyra Free, PA-C  ondansetron (ZOFRAN) 4 MG tablet Take 1 tablet (4 mg total) by mouth every 8 (eight) hours as needed for nausea or vomiting. 06/13/22  Yes Dechelle Attaway, Almyra Free, PA-C  potassium chloride SA (K-DUR,KLOR-CON) 20 MEQ tablet Take 1 tablet (20 mEq total) by mouth daily. 12/01/12  Yes Lajean Saver, MD  promethazine (PHENERGAN) 25 MG tablet Take 25 mg by mouth every 6 (six) hours as needed for nausea. 06/11/22  Yes [provider]      Allergies  Amoxicillin-pot clavulanate, Doxycycline, Duloxetine, Methocarbamol, Other, Oxycodone, Penicillins, Zolpidem, Ambien [zolpidem tartrate], Azithromycin, Cefprozil, Celebrex [celecoxib], Cephalosporins, Cymbalta [duloxetine hcl], Lunesta [eszopiclone], Mobic [meloxicam], Oxycodone-acetaminophen, Prednisolone, Sulfa antibiotics, Ultram [tramadol], Valtrex [valacyclovir hcl], and Voltaren [diclofenac sodium]    Review of Systems   Review of Systems  Constitutional:  Positive for chills, fatigue and fever.  HENT:  Negative for congestion and sore throat.   Eyes: Negative.   Respiratory:  Negative  for chest tightness and shortness of breath.   Cardiovascular:  Negative for chest pain.  Gastrointestinal:  Positive for abdominal pain, diarrhea, nausea and vomiting. Negative for blood in stool.  Genitourinary: Negative.   Musculoskeletal:  Negative for arthralgias, joint swelling and neck pain.  Skin: Negative.  Negative for rash and wound.  Neurological:  Positive for weakness. Negative for dizziness, light-headedness, numbness and headaches.  Psychiatric/Behavioral: Negative.    All other systems reviewed and are negative.   Physical Exam Updated Vital Signs BP 107/60   Pulse 79   Temp 97.7 F (36.5 C) (Oral)   Resp 16   Ht '5\' 3"'$  (1.6 m)   Wt 61.2 kg   LMP 11/14/2012   SpO2 99%   BMI 23.91 kg/m  Physical Exam Vitals and nursing note reviewed.  Constitutional:      Appearance: She is well-developed.  HENT:     Head: Normocephalic and atraumatic.  Eyes:     Conjunctiva/sclera: Conjunctivae normal.  Cardiovascular:     Rate and Rhythm: Normal rate and regular rhythm.     Heart sounds: Normal heart sounds.  Pulmonary:     Effort: Pulmonary effort is normal.     Breath sounds: Normal breath sounds. No wheezing.  Abdominal:     General: Bowel sounds are normal. There is distension.     Palpations: Abdomen is soft.     Tenderness: There is generalized abdominal tenderness. There is no guarding or rebound.     Comments: Mild distention and bilateral lower abdomen with increased tympany to percussion.  Bowel sounds are normal.  Musculoskeletal:        General: Normal range of motion.     Cervical back: Normal range of motion.  Skin:    General: Skin is warm and dry.  Neurological:     Mental Status: She is alert.     ED Results / Procedures / Treatments   Labs (all labs ordered are listed, but only abnormal results are displayed) Labs Reviewed  CBC WITH DIFFERENTIAL/PLATELET - Abnormal; Notable for the following components:      Result Value   Monocytes  Absolute 1.8 (*)    All other components within normal limits  COMPREHENSIVE METABOLIC PANEL - Abnormal; Notable for the following components:   Sodium 129 (*)    Chloride 96 (*)    Glucose, Bld 102 (*)    Albumin 3.3 (*)    All other components within normal limits  LIPASE, BLOOD - Abnormal; Notable for the following components:   Lipase 64 (*)    All other components within normal limits  URINALYSIS, ROUTINE W REFLEX MICROSCOPIC - Abnormal; Notable for the following components:   Hgb urine dipstick SMALL (*)    Ketones, ur 20 (*)    All other components within normal limits    EKG None  Radiology No results found.  Procedures Procedures    Medications Ordered in ED Medications  sodium chloride 0.9 % bolus 1,000 mL (0 mLs Intravenous Stopped 06/13/22 1218)  promethazine (PHENERGAN) 12.5 mg in  sodium chloride 0.9 % 50 mL IVPB (0 mg Intravenous Stopped 06/13/22 1218)  sodium chloride 0.9 % bolus 1,000 mL (0 mLs Intravenous Stopped 06/13/22 1401)  diphenoxylate-atropine (LOMOTIL) 2.5-0.025 MG per tablet 2 tablet (2 tablets Oral Given 06/13/22 1452)    ED Course/ Medical Decision Making/ A&P                           Medical Decision Making Patient presenting with significant uncontrolled diarrhea since traveling to [place].  Husband traveled with her is asymptomatic however.  Had a low-grade fever which resolved 2 days ago.  Has been seen by her PCP, screened for COVID and influenza and was negative.  Stool samples from the PCP office pending, she was empirically placed on Levaquin, also taking Phenergan which helps her nausea but makes her very drowsy, has had doses of Imodium which has not slowed down the diarrhea.  Nonbloody.  Labs today are reassuring, she is not significantly dehydrated, she does have a mild hyponatremia at 132, this was replaced with 2 L of normal saline per IV.  Other labs today are reassuring including normal WBC count.  She does have a slight bump in her  lipase at 64 of unclear etiology, she does not have pain that localizes to the left upper quadrant.  She also has normal LFTs.  She does endorse more than normal alcohol consumption while on her vacation but has had no alcohol intake in the past 7 days.  Labs today suggest this is a viral gastroenteritis, we will switch her to Zofran for less sedating properties as we are also going to add Lomotil since Imodium has not improved her diarrhea.  Plan follow-up with her PCP for recheck if symptoms are not improving with this treatment plan.  We also discussed increased home fluids and brat diet.  Amount and/or Complexity of Data Reviewed Labs: ordered.    Details: Reviewed above  Risk Prescription drug management.           Final Clinical Impression(s) / ED Diagnoses Final diagnoses:  Traveler's diarrhea    Rx / DC Orders ED Discharge Orders          Ordered    diphenoxylate-atropine (LOMOTIL) 2.5-0.025 MG tablet  4 times daily PRN        06/13/22 1552    ondansetron (ZOFRAN) 4 MG tablet  Every 8 hours PRN        06/13/22 1552    ondansetron (ZOFRAN) 4 MG tablet  Every 8 hours PRN        06/13/22 1553              Evalee Jefferson, PA-C 06/13/22 1602    Carmin Muskrat, MD 06/14/22 1433

## 2022-06-15 MED FILL — Ondansetron HCl Tab 4 MG: ORAL | Qty: 4 | Status: AC

## 2023-01-05 ENCOUNTER — Other Ambulatory Visit: Payer: Self-pay | Admitting: Gastroenterology

## 2023-01-05 DIAGNOSIS — R1310 Dysphagia, unspecified: Secondary | ICD-10-CM

## 2023-03-02 ENCOUNTER — Ambulatory Visit (INDEPENDENT_AMBULATORY_CARE_PROVIDER_SITE_OTHER): Payer: BC Managed Care – PPO | Admitting: Nurse Practitioner

## 2023-03-02 ENCOUNTER — Encounter: Payer: Self-pay | Admitting: Nurse Practitioner

## 2023-03-02 VITALS — BP 120/72 | Ht 62.5 in | Wt 139.0 lb

## 2023-03-02 DIAGNOSIS — Z780 Asymptomatic menopausal state: Secondary | ICD-10-CM | POA: Diagnosis not present

## 2023-03-02 DIAGNOSIS — Z01419 Encounter for gynecological examination (general) (routine) without abnormal findings: Secondary | ICD-10-CM

## 2023-03-02 NOTE — Progress Notes (Signed)
Yolanda Becker [birth year] [id number]   History:  62 y.o. G2P0023 presents for annual exam. Postmenopausal - no HRT, no bleeding. Normal pap and mammogram history. Not sexually active due to pain with intercourse and husband's low testosterone. She has not tolerated vaginal estrogen, vit E suppositories, OTC lubricants, coconut oil, Revaree. Has very sensitive skin. HTN managed by PCP, H/O melanoma of left cheek.   Gynecologic History Patient's last menstrual period was 11/14/2012.   Contraception: post menopausal status Sexually active: No  Health maintenance Last Pap: 10/29/2021. Results were: Normal, 3-year repeat Last mammogram: 01/2023. Results were: Normal per patient Last colonoscopy: 03/07/2018. Results were: Normal, 5-year recall  Last Dexa: Not indicated  Past medical history, past surgical history, family history and social history were all reviewed and documented in the EPIC chart. Married. Owns cleaning business. Children ages 24, 22 and 26, 6 grandchildren ages 67-17. Father with history of colon cancer.   ROS:  A ROS was performed and pertinent positives and negatives are included.  Exam:  Vitals:   03/02/23 0857  BP: 120/72  Weight: 139 lb (63 kg)  Height: 5' 2.5" (1.588 m)     Body mass index is 25.02 kg/m.  General appearance:  Normal Thyroid:  Symmetrical, normal in size, without palpable masses or nodularity. Respiratory  Auscultation:  Clear without wheezing or rhonchi Cardiovascular  Auscultation:  Regular rate, without rubs, murmurs or gallops  Edema/varicosities:  Not grossly evident Abdominal  Soft,nontender, without masses, guarding or rebound.  Liver/spleen:  No organomegaly noted  Hernia:  None appreciated  Skin  Inspection:  Grossly normal   Breasts: Examined lying and sitting.   Right: Without masses, retractions, discharge or axillary adenopathy.   Left: Without masses, retractions, discharge or axillary adenopathy. Genitourinary    Inguinal/mons:  Normal without inguinal adenopathy  External genitalia:  Normal appearing vulva with no masses, tenderness, or lesions  BUS/Urethra/Skene's glands:  Normal  Vagina:  Normal appearing with normal color and discharge, no lesions  Cervix:  Normal appearing without discharge or lesions. Atrophic changes  Uterus:  Normal in size, shape and contour.  Midline and mobile, nontender  Adnexa/parametria:     Rt: Normal in size, without masses or tenderness.   Lt: Normal in size, without masses or tenderness.  Anus and perineum: Normal  Digital rectal exam: Deferred  Patient informed chaperone available to be present for breast and pelvic exam. Patient has requested no chaperone to be present. Patient has been advised what will be completed during breast and pelvic exam.   Assessment/Plan:  62 y.o. [id number] for annual exam.   Well female exam with routine gynecological exam - Education provided on SBEs, importance of preventative screenings, current guidelines, high calcium diet, regular exercise, and multivitamin daily. Labs with PCP.   Postmenopausal - no HRT, no bleeding  Vaginal dryness, menopausal - Not sexually active due to pain with intercourse and husband's low testosterone. She has not tolerated vaginal estrogen, vit E suppositories, OTC lubricants, coconut oil, Revaree. Has very sensitive skin.  Screening for cervical cancer - Normal Pap history.  Will repeat at 3-year interval per guidelines.   Screening for breast cancer - Normal mammogram history.  Continue annual screenings.  Normal breast exam today.  Screening for colon cancer - 2019 colonoscopy. Colonoscopy scheduled this month.   Screening for osteoporosis - Average risk. Will plan for DXA at age 72.   Follow up in 1 year for annual.       Franklin Clapsaddle A Earlene Plater  WHNP, 9:11 AM 03/02/2023

## 2023-03-15 ENCOUNTER — Ambulatory Visit
Admission: RE | Admit: 2023-03-15 | Discharge: 2023-03-15 | Disposition: A | Discharge status: Home / Self Care | Payer: BC Managed Care – PPO | Source: Ambulatory Visit | Attending: Gastroenterology | Admitting: Gastroenterology

## 2023-03-15 ENCOUNTER — Other Ambulatory Visit: Payer: 59

## 2023-03-15 DIAGNOSIS — R1310 Dysphagia, unspecified: Secondary | ICD-10-CM
